# Patient Record
Sex: Male | Born: 1940 | Race: White | Hispanic: No | Marital: Single | Smoking: Former smoker
Health system: Southern US, Community
[De-identification: ages and names within clinical notes are randomized; demographics above are authoritative.]

## PROBLEM LIST (undated history)

## (undated) DIAGNOSIS — R0600 Dyspnea, unspecified: Secondary | ICD-10-CM

## (undated) DIAGNOSIS — M109 Gout, unspecified: Secondary | ICD-10-CM

## (undated) DIAGNOSIS — K2100 Gastro-esophageal reflux disease with esophagitis, without bleeding: Secondary | ICD-10-CM

## (undated) DIAGNOSIS — R0602 Shortness of breath: Secondary | ICD-10-CM

## (undated) DIAGNOSIS — M25511 Pain in right shoulder: Secondary | ICD-10-CM

## (undated) DIAGNOSIS — M069 Rheumatoid arthritis, unspecified: Secondary | ICD-10-CM

## (undated) DIAGNOSIS — J9611 Chronic respiratory failure with hypoxia: Secondary | ICD-10-CM

## (undated) DIAGNOSIS — M25569 Pain in unspecified knee: Secondary | ICD-10-CM

## (undated) DIAGNOSIS — E785 Hyperlipidemia, unspecified: Secondary | ICD-10-CM

## (undated) DIAGNOSIS — R059 Cough, unspecified: Secondary | ICD-10-CM

## (undated) DIAGNOSIS — I350 Nonrheumatic aortic (valve) stenosis: Secondary | ICD-10-CM

## (undated) DIAGNOSIS — M542 Cervicalgia: Secondary | ICD-10-CM

## (undated) DIAGNOSIS — I2510 Atherosclerotic heart disease of native coronary artery without angina pectoris: Secondary | ICD-10-CM

## (undated) DIAGNOSIS — E559 Vitamin D deficiency, unspecified: Secondary | ICD-10-CM

## (undated) DIAGNOSIS — N529 Male erectile dysfunction, unspecified: Secondary | ICD-10-CM

## (undated) DIAGNOSIS — J849 Interstitial pulmonary disease, unspecified: Secondary | ICD-10-CM

## (undated) DIAGNOSIS — R05 Cough: Secondary | ICD-10-CM

## (undated) DIAGNOSIS — E538 Deficiency of other specified B group vitamins: Secondary | ICD-10-CM

## (undated) DIAGNOSIS — K219 Gastro-esophageal reflux disease without esophagitis: Secondary | ICD-10-CM

## (undated) DIAGNOSIS — C61 Malignant neoplasm of prostate: Secondary | ICD-10-CM

## (undated) HISTORY — DX: Vitamin D deficiency, unspecified: E55.9

## (undated) HISTORY — DX: Gastro-esophageal reflux disease with esophagitis, without bleeding: K21.00

## (undated) HISTORY — DX: Male erectile dysfunction, unspecified: N52.9

## (undated) HISTORY — DX: Gout, unspecified: M10.9

## (undated) HISTORY — DX: Cervicalgia: M54.2

## (undated) HISTORY — DX: Deficiency of other specified B group vitamins: E53.8

## (undated) HISTORY — DX: Rheumatoid arthritis, unspecified: M06.9

## (undated) HISTORY — PX: CARDIAC CATHETERIZATION: SHX172

## (undated) HISTORY — DX: Pain in unspecified knee: M25.569

## (undated) HISTORY — DX: Gastro-esophageal reflux disease with esophagitis: K21.0

## (undated) HISTORY — DX: Pain in right shoulder: M25.511

## (undated) HISTORY — DX: Cough, unspecified: R05.9

## (undated) HISTORY — DX: Shortness of breath: R06.02

## (undated) HISTORY — PX: HERNIA REPAIR: SHX51

## (undated) HISTORY — DX: Hyperlipidemia, unspecified: E78.5

## (undated) HISTORY — DX: Cough: R05

## (undated) HISTORY — DX: Chronic respiratory failure with hypoxia: J96.11

## (undated) HISTORY — DX: Malignant neoplasm of prostate: C61

## (undated) HISTORY — DX: Interstitial pulmonary disease, unspecified: J84.9

---

## 1988-03-04 HISTORY — PX: BACK SURGERY: SHX140

## 2001-03-04 HISTORY — PX: PROSTATECTOMY: SHX69

## 2014-01-26 ENCOUNTER — Ambulatory Visit (INDEPENDENT_AMBULATORY_CARE_PROVIDER_SITE_OTHER): Payer: Medicare PPO | Admitting: Critical Care Medicine

## 2014-01-26 ENCOUNTER — Encounter: Payer: Self-pay | Admitting: Critical Care Medicine

## 2014-01-26 ENCOUNTER — Other Ambulatory Visit (INDEPENDENT_AMBULATORY_CARE_PROVIDER_SITE_OTHER): Payer: Medicare PPO

## 2014-01-26 ENCOUNTER — Other Ambulatory Visit: Payer: Self-pay

## 2014-01-26 VITALS — BP 100/68 | HR 67 | Temp 98.4°F | Ht 68.0 in | Wt 167.8 lb

## 2014-01-26 DIAGNOSIS — J8410 Pulmonary fibrosis, unspecified: Secondary | ICD-10-CM

## 2014-01-26 DIAGNOSIS — M25569 Pain in unspecified knee: Secondary | ICD-10-CM | POA: Insufficient documentation

## 2014-01-26 DIAGNOSIS — K2100 Gastro-esophageal reflux disease with esophagitis, without bleeding: Secondary | ICD-10-CM

## 2014-01-26 DIAGNOSIS — E785 Hyperlipidemia, unspecified: Secondary | ICD-10-CM

## 2014-01-26 DIAGNOSIS — E559 Vitamin D deficiency, unspecified: Secondary | ICD-10-CM

## 2014-01-26 DIAGNOSIS — M25562 Pain in left knee: Secondary | ICD-10-CM

## 2014-01-26 DIAGNOSIS — J9611 Chronic respiratory failure with hypoxia: Secondary | ICD-10-CM

## 2014-01-26 LAB — CK: Total CK: 104 U/L (ref 7–232)

## 2014-01-26 LAB — SEDIMENTATION RATE: SED RATE: 87 mm/h — AB (ref 0–22)

## 2014-01-26 MED ORDER — PREDNISONE 10 MG PO TABS: ORAL_TABLET | ORAL | Status: DC

## 2014-01-26 MED ORDER — OMEPRAZOLE 20 MG PO CPDR: 20.0000 mg | DELAYED_RELEASE_CAPSULE | Freq: Every day | ORAL | Status: AC

## 2014-01-26 NOTE — Patient Instructions (Signed)
Take omeprazole DAILY Follow reflux diet Start Prednisone 10mg  Take 4 for four days 3 for four days then 2 daily and STAY Labs today Overnight oxygen test will be obtained Lung function test at [place]

## 2014-01-26 NOTE — Progress Notes (Signed)
Subjective:    Patient ID: Jeff Lynch, male    DOB: [birth year], 73 y.o.   MRN: [id number]  HPI Comments: Pt with L knee issues.  Hx of DOE x one year.  Seems about the same.  Pt with a cough, worse with exertion.  No real chest pain.  Weight is down on purpose.  Ex smoker x 1yrs.  Shortness of Breath This is a chronic problem. The current episode started more than 1 year ago. The problem occurs daily (exertion only, walking short distances). The problem has been unchanged. Associated symptoms include leg pain, leg swelling, sputum production and wheezing. Pertinent negatives include no chest pain, claudication, coryza, ear pain, fever, headaches, hemoptysis, neck pain, orthopnea, PND, rash, rhinorrhea, sore throat, swollen glands, syncope or vomiting. The symptoms are aggravated by any activity and exercise. Associated symptoms comments: Cough is prod mucus. Risk factors include smoking. He has tried steroid inhalers and beta agonist inhalers for the symptoms. The treatment provided no relief. His past medical history is significant for chronic lung disease. There is no history of allergies, aspirin allergies, asthma, bronchiolitis, CAD, DVT, a heart failure, PE, pneumonia or a recent surgery.   Past Medical History  Diagnosis Date  . Prostate cancer   . Vitamin D deficiency   . Gout   . Hyperlipemia   . SOB (shortness of breath)   . Cough   . Vitamin B12 deficiency   . Esophagitis, reflux   . Knee joint pain   . Cervical pain   . Impotence, organic   . Acute pain of right shoulder      Family History  Problem Relation Age of Onset  . Liver disease Father   . Prostate cancer Brother      History   Social History  . Marital Status: Single    Spouse Name: N/A    Number of Children: N/A  . Years of Education: N/A   Occupational History  . [employer]     Stocking  . [employer]     retired   Social History Main Topics  . Smoking status: Former Smoker -- 1.00  packs/day for 40 years    Types: Cigarettes    Quit date: 01/02/2002  . Smokeless tobacco: Never Used  . Alcohol Use: No  . Drug Use: No  . Sexual Activity: Not on file   Other Topics Concern  . Not on file   Social History Narrative  . No narrative on file     No Known Allergies   No outpatient prescriptions prior to visit.   No facility-administered medications prior to visit.       Review of Systems  Constitutional: Negative for fever.  HENT: Negative for ear pain, nosebleeds, postnasal drip, rhinorrhea, sinus pressure, sneezing, sore throat, trouble swallowing and voice change.   Respiratory: Positive for cough, sputum production, shortness of breath and wheezing. Negative for apnea, hemoptysis, choking and chest tightness.   Cardiovascular: Positive for leg swelling. Negative for chest pain, orthopnea, claudication, syncope and PND.  Gastrointestinal: Negative for vomiting.       Pos GERD.  Uses PPI prn , has occ indigestion now  Musculoskeletal: Negative for neck pain.  Skin: Negative for rash.  Neurological: Negative for headaches.  All other systems reviewed and are negative.      Objective:   Physical Exam Filed Vitals:   01/26/14 1143  BP: 100/68  Pulse: 67  Temp: 98.4 F (36.9 C)  TempSrc: Oral  Height: 5\' 8"  (1.727 m)  Weight: 167 lb 12.8 oz (76.114 kg)  SpO2: 96%    Gen: Pleasant, well-nourished, in no distress,  normal affect  ENT: No lesions,  mouth clear,  oropharynx clear, no postnasal drip  Neck: No JVD, no TMG, no carotid bruits  Lungs: No use of accessory muscles, no dullness to percussion, dry rales at the bases distant breath sounds   Cardiovascular: RRR, heart sounds normal, no murmur or gallops, no peripheral edema  Abdomen: soft and NT, no HSM,  BS normal  Musculoskeletal: No deformities, no cyanosis or clubbing  Neuro: alert, non focal  Skin: Warm, no lesions or rashes  No results found.   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 allopurinol (ZYLOPRIM) 100 MG tablet Take 1 tablet by mouth daily.  [name] aspirin 81 MG tablet Take 81 mg by mouth daily.  . Cyanocobalamin (VITAMIN B-12 PO) Take 1 tablet by mouth daily.  . meloxicam (MOBIC) 15 MG tablet Take 1 tablet by mouth daily.  . naproxen sodium (ALEVE) 220 MG tablet Take 220 mg by mouth as needed.  [name] omeprazole (PRILOSEC) 20 MG capsule Take 1 capsule (20 mg total) by mouth daily.  . pravastatin (PRAVACHOL) 80 MG tablet Take 1 tablet by mouth daily.  . predniSONE (DELTASONE) 10 MG tablet Take 4 for four days 3 for four days then two daily  . [DISCONTINUED] omeprazole (PRILOSEC) 20 MG capsule Take 1 capsule by mouth as needed.

## 2014-01-26 NOTE — Assessment & Plan Note (Signed)
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

## 2014-01-27 LAB — RHEUMATOID FACTOR: RHEUMATOID FACTOR: 503 [IU]/mL — AB (ref <=14)

## 2014-01-28 LAB — CYCLIC CITRUL PEPTIDE ANTIBODY, IGG: Cyclic Citrullin Peptide Ab: 231.3 U/mL — ABNORMAL HIGH (ref 0.0–5.0)

## 2014-01-28 LAB — ANA: ANA: NEGATIVE

## 2014-01-31 ENCOUNTER — Encounter: Payer: Self-pay | Admitting: Critical Care Medicine

## 2014-01-31 DIAGNOSIS — M069 Rheumatoid arthritis, unspecified: Secondary | ICD-10-CM | POA: Insufficient documentation

## 2014-02-01 LAB — PULMONARY FUNCTION TEST

## 2014-02-02 ENCOUNTER — Telehealth: Payer: Self-pay | Admitting: Critical Care Medicine

## 2014-02-02 NOTE — Telephone Encounter (Signed)
Spoke with pt, states that his daughter dropped off fmla forms earlier today.  Routing to Crystal to follow up on.

## 2014-02-03 NOTE — Telephone Encounter (Signed)
Pt calling tio check on status of FMLA forms, wanting nurse to make sure that she gets forms b/c they need to be in by the 11th.Jeff Lynch

## 2014-02-03 NOTE — Telephone Encounter (Signed)
Where these forms dropped off at [place] or the [place] office?  If they are at [facility], can we please send the forms to [employer] as they are who handles FLMA and let [employer] know forms needs to be in by the 11th and let pt know status.  If forms are at [facility], can we please call [place] office to have them fax forms to [place] -- Dr. Joya Gaskins will not be back in [place] until Dec 15.  Once forms received at [facility], they will need to be sent to [place] will need to be advised forms need to be in by Dec 11.    Triage, will you please assist with this as I will be helping in [place] today and tomorrow?  Thank you.

## 2014-02-03 NOTE — Telephone Encounter (Signed)
Paperwork dropped off at [employer] office.  Checked PW's box, papers not in there.  Spoke with Vida Roller, she states that pt's daughter brought paperwork in yesterday evening, she directed her down to [employer] to have them dropped off.  Called [employer], let them know that patient is requesting paperwork to be completed by 12/11.  Was told that they cannot promise a date since there is paperwork to be filled out before his and they do paperwork in the order it is received.  Called pt to make him aware of this.  He was not happy about not having a definitive completion date.  I advised him about how [employer] does their paperwork and assured him that paperwork would be completed as completely and timely as possible and he would be notified when it is complete.  He then hung up the phone.  Routing to [employer] just as fyi.

## 2014-02-03 NOTE — Telephone Encounter (Signed)
This completed form has been brought up to 2nd floor by [employer], just needs PW's signature for completion.  Placed in PW's box.  Forwarding to [employer] as [employer]

## 2014-02-08 NOTE — Telephone Encounter (Signed)
Pt calling a/b status of fmla forms needing them by Thursday pt can be reached @ [phone].Jeff Lynch

## 2014-02-08 NOTE — Telephone Encounter (Signed)
Pt is aware that PW is out of office and will not be back until Thursday (in [place] office that AM); pt states the FMLA is due back on Thursday 02-10-14 and really needs this taken care of. Will forward to Crystal to let her know to try and get nurse to take to [place] office for PW to sign and return them to our office.

## 2014-02-09 NOTE — Telephone Encounter (Signed)
Forms given to [facility] to have PW sign while in [place] tomorrow.

## 2014-02-10 ENCOUNTER — Telehealth: Payer: Self-pay | Admitting: Critical Care Medicine

## 2014-02-10 NOTE — Telephone Encounter (Signed)
This has been filled out by PW and will be returned to [facility] tomorrow (friday 12/10). Forwarding to [employer] as [employer]

## 2014-02-10 NOTE — Telephone Encounter (Signed)
Clarise Cruz with [facility] RT Dept called back.  Reports there machine went down during pt's test d/t leak in machine.  This was not noticed until she was trying to save and complete test.  The nitrogen washout portion has not yet been completed.  Clarise Cruz spoke with pt and has scheduled him to come in on Tuesday to complete test.  Once test is completed, she will send results.  Will send to Dr. Joya Gaskins as Juluis Rainier.

## 2014-02-10 NOTE — Telephone Encounter (Signed)
Spoke with [facility] with [facility] Records - was advised I would need to speak with Cardiopulmonary Rehab Dept and was transferred to ext [phone]. Spoke with Opal Sidles with Cardiopulmonary Rehab.  Was directed to RT Dept at ext [phone] or [phone].  Was advised by RT Dept they would look for results and would fax to triage.  Will await results.

## 2014-02-10 NOTE — Telephone Encounter (Signed)
Called spoke with pt. He reports he had PFT done at [place] about couple weeks ago. Pt is aware PW not here in [place] this week and will make sure we get results for him to review. We will call once results reviewed. Please advise Crystal thanks

## 2014-02-10 NOTE — Telephone Encounter (Signed)
Spoke with pt - Discussed below per Caryl Pina.  He verbalized understanding and is ok with this.   Tomorrow, once we have the completed forms at [facility], he is requesting [employer] call his daughter, [name], at [place] to pick forms up.  Advised we would inform [employer] of this.  He verbalized understanding.

## 2014-02-10 NOTE — Telephone Encounter (Signed)
noted 

## 2014-02-11 NOTE — Telephone Encounter (Signed)
Forms received. Spoke with Hoyle Sauer in [place].  Advised forms have now been sent down and reminded Hoyle Sauer pt reports forms are due today.  Asked she call pt's daughter [name] at [facility] to pick copy up per pt's request.  Hoyle Sauer verbalized understanding. Pt aware and voiced no furthe rquestions or concerns at this time.

## 2014-02-15 ENCOUNTER — Telehealth: Payer: Self-pay | Admitting: Critical Care Medicine

## 2014-02-15 LAB — PULMONARY FUNCTION TEST

## 2014-02-15 NOTE — Telephone Encounter (Signed)
FMLA forms were sent back to [employer] on 02/11/14.

## 2014-02-15 NOTE — Telephone Encounter (Signed)
Attempted to call pt. Line went dead after 3 rings. Will try back.

## 2014-02-15 NOTE — Telephone Encounter (Signed)
Needs the back to work date on the papers we need to call [place]   [phone]

## 2014-02-15 NOTE — Telephone Encounter (Signed)
Spoke with the pt  He states that Jeff Lynch is needing the return to work date written on his FMLA forms  Crystal, do you know the status of these forms  Please advise, thanks!

## 2014-02-16 ENCOUNTER — Telehealth: Payer: Self-pay | Admitting: Critical Care Medicine

## 2014-02-16 DIAGNOSIS — J9611 Chronic respiratory failure with hypoxia: Secondary | ICD-10-CM

## 2014-02-16 NOTE — Telephone Encounter (Signed)
ONO positive for desaturation Pt will need 2Liters QHS oxygen

## 2014-02-17 NOTE — Telephone Encounter (Signed)
[phone] pt is looking for his fmla forms needs it to have the return to work date i called [employer] and they stated they have never had it

## 2014-02-17 NOTE — Telephone Encounter (Signed)
Pt is aware. Order placed. Nothing further needed

## 2014-02-17 NOTE — Telephone Encounter (Signed)
Called spoke with pt. Aware of results. Pt reports he has been already using 2 liters at bedtime. He reports he was set up with the O2 before the test was done. Will make Dr. Joya Gaskins aware.

## 2014-02-17 NOTE — Telephone Encounter (Signed)
Noted  Let him know test showed continued need for the oxygen at 2L QHS Make sure DME company knows test is positive for continued oxygen need at hs

## 2014-02-17 NOTE — Telephone Encounter (Signed)
Opened in error

## 2014-02-17 NOTE — Telephone Encounter (Signed)
I need someone to address this issue. See prior note

## 2014-02-17 NOTE — Telephone Encounter (Signed)
Pt called back again. Crystal please advise thanks

## 2014-02-17 NOTE — Addendum Note (Signed)
Addended by: Inge Rise on: 02/17/2014 05:33 PM   Modules accepted: Orders

## 2014-02-17 NOTE — Telephone Encounter (Signed)
I contacted Jeff Lynch in [employer]-states that the step-daughter(Bonnie) picked up completed forms as she wanted to fax the FMLA papers back herself.     No back to work date on [employer] ASAP! Pt aware that I will send to [place] and have her follow up with [employer] to get this corrected. [name] (step daughter) has the original forms; we only have copies in [employer].   01-31-14 first day out of work.

## 2014-02-18 NOTE — Telephone Encounter (Addendum)
Spoke with Louise with [employer] to try to obtain PFTs results from Tuesday.  Was advised she does not have results yet.  She will call Resp Dept to track them and will call back with update.

## 2014-02-18 NOTE — Telephone Encounter (Signed)
i am sorry, i do not know when he can return to work .  He has severe lung disease and i do not have an end time to offer  That is why it is uncertain.

## 2014-02-18 NOTE — Telephone Encounter (Signed)
Called [employer] and received VM. Will need to call back Monday AM. Called spoke with pt. He is aware of below. Advised we will call [employer] Monday AM.

## 2014-02-18 NOTE — Telephone Encounter (Signed)
Forms are scanned into pt's chart.  The start date is marked as 01/31/2014 with the end date as "uncertain."  Dr. Joya Gaskins, pls advise if you are able to provide a specific date pt may return back to work.    Per Joellen Jersey, pt has until Dec 27 to turn this information in but would like it handled ASAP.  Once Dr. Joya Gaskins answers, we will need to let [employer] and pt know of his response.  [employer] should correct this and will need to be asked to please refax forms once updated.  Thank you.

## 2014-02-21 NOTE — Telephone Encounter (Signed)
Received call from [place] in [place]. She stated she would have Hoyle Sauer call tomorrow (02/22/14) regarding the pt's FMLA.

## 2014-02-21 NOTE — Telephone Encounter (Signed)
ATC [employer]--sent straight to voicemail. LM with Apolonio Schneiders to return call

## 2014-02-21 NOTE — Telephone Encounter (Signed)
Put the 90day date on the form and will sign

## 2014-02-21 NOTE — Telephone Encounter (Signed)
Called and spoke with pt and he stated that his company will only give him 90 days and at the end of the 90 days he either has to come back to work or give his job up.  He stated that his 90 days will be up the end of feb or march 1 or 2.   He stated that he will need a date on his FMLA papers so they will continue to pay him while he is out.  He is not looking to go back to work before this time or if PW says he will not be able to go back.  His company is requesting that a date be put on the FMLA form instead of uncertain.  PW please advise. thanks

## 2014-02-22 NOTE — Telephone Encounter (Signed)
PFT results from [employer] [facility] [employer] done on 02/15/14 received and placed in Dr. Bettina Gavia look at.

## 2014-02-22 NOTE — Telephone Encounter (Signed)
Spoke with [facility] with [facility] medical Records.  She will fax results to triage.  Will await fax.

## 2014-02-23 NOTE — Telephone Encounter (Signed)
LMTCB for [employer] to call us back and let us know the status on the paperwork. Crystal, did you receive any papers on this pt? Ralls Bing, CMA

## 2014-02-23 NOTE — Telephone Encounter (Signed)
Pt daughter came in to check on fmla papers please call daughter at [phone] ext [phone] or you can email the forms to her at [email or web address], states patient needed 90 day end date on forms.

## 2014-02-23 NOTE — Telephone Encounter (Signed)
Spoke with pt's daughter.  Insurance company is requesting forms to be in by 02/27/14.  Dr. Joya Gaskins not in office to sign until 03/02/14.  Daughter will have pt call company to advise of this and see if date can be extend given circumstances and short week d/t holiday.  He is to have them give office call if needed before 5 pm today or between 8am-12 pm tomorrow.  Jeff Lynch is aware we will contact her once forms are updated and signed.  She verbalized understanding, was very appreciative of this, and voiced no further questions or concerns at this time.

## 2014-02-23 NOTE — Telephone Encounter (Signed)
i will review on my return 03/02/14

## 2014-02-23 NOTE — Telephone Encounter (Signed)
I have not received any papers on this.  Can we please check on this again before the end of the day with [employer].

## 2014-02-23 NOTE — Telephone Encounter (Signed)
LM for Jeff Lynch in [place] to return call.

## 2014-02-23 NOTE — Telephone Encounter (Signed)
Jeff Lynch with [place]  Returned call. States that Hoyle Sauer is gone for the week and will not be back until 12/29(tues). There is no one else down in [employer]/medical records that can help with this process as Hoyle Sauer is the only person. Will send to Crystal to follow up on Tuesday 12/29.

## 2014-02-28 NOTE — Telephone Encounter (Signed)
lmtcb for [employer] -- can they prepare the paperwork as requested with below return to work date to be ready for Dr. Joya Gaskins to sign on Wed, Dec 30 when he returns to office?

## 2014-03-01 NOTE — Telephone Encounter (Signed)
Spoke with Hoyle Sauer in [place].  She will prepare paperwork and will send up today for PW to sign tomorrow.  Will await forms.

## 2014-03-01 NOTE — Telephone Encounter (Signed)
Forms received from [employer].  Will have PW address tomorrow.

## 2014-03-02 NOTE — Telephone Encounter (Signed)
Certification of Health Care Provider form completed as requested and signed by Dr. Joya Gaskins.  I sent forms to [facility] in [facility] who is aware.  Hoyle Sauer will contact daughter, [name], to advise when forms are ready for pick up/to be faxed.    I spoke with pt as I could not contact [name] at # below.  Pt is aware forms were sent to [place] will be contacting [name] once ready for pick up or to see if forms need to be faxed.  Pt verbalized understanding, was very appreciative of this, and voiced no further questions or concerns at this time.  Note:  Pt stated Howell Rucks has already "approved this."  He is aware the most recent copy (forms as per his request) were completed.  He verbalized understanding and voiced no further questions or concerns at this time.

## 2014-03-03 ENCOUNTER — Telehealth: Payer: Self-pay | Admitting: Critical Care Medicine

## 2014-03-03 DIAGNOSIS — J8410 Pulmonary fibrosis, unspecified: Secondary | ICD-10-CM

## 2014-03-03 MED ORDER — MYCOPHENOLATE MOFETIL 500 MG PO TABS
500.0000 mg | ORAL_TABLET | Freq: Two times a day (BID) | ORAL | Status: DC
Start: 2014-03-03 — End: 2014-04-18

## 2014-03-03 NOTE — Telephone Encounter (Signed)
Opened in error

## 2014-03-03 NOTE — Telephone Encounter (Signed)
Discussed pfts with the pt  Will start cellcept  500mg  bid

## 2014-03-08 ENCOUNTER — Institutional Professional Consult (permissible substitution): Payer: Self-pay | Admitting: Critical Care Medicine

## 2014-03-22 ENCOUNTER — Encounter: Payer: Self-pay | Admitting: Critical Care Medicine

## 2014-03-22 ENCOUNTER — Ambulatory Visit (INDEPENDENT_AMBULATORY_CARE_PROVIDER_SITE_OTHER): Payer: Medicare PPO | Admitting: Critical Care Medicine

## 2014-03-22 VITALS — BP 124/72 | HR 86 | Temp 98.3°F | Ht 68.0 in | Wt 177.0 lb

## 2014-03-22 DIAGNOSIS — M170 Bilateral primary osteoarthritis of knee: Secondary | ICD-10-CM | POA: Diagnosis not present

## 2014-03-22 DIAGNOSIS — T50905S Adverse effect of unspecified drugs, medicaments and biological substances, sequela: Secondary | ICD-10-CM | POA: Diagnosis not present

## 2014-03-22 DIAGNOSIS — M11241 Other chondrocalcinosis, right hand: Secondary | ICD-10-CM | POA: Diagnosis not present

## 2014-03-22 DIAGNOSIS — Z8739 Personal history of other diseases of the musculoskeletal system and connective tissue: Secondary | ICD-10-CM | POA: Diagnosis not present

## 2014-03-22 DIAGNOSIS — J8410 Pulmonary fibrosis, unspecified: Secondary | ICD-10-CM | POA: Diagnosis not present

## 2014-03-22 DIAGNOSIS — M0589 Other rheumatoid arthritis with rheumatoid factor of multiple sites: Secondary | ICD-10-CM | POA: Diagnosis not present

## 2014-03-22 DIAGNOSIS — M2550 Pain in unspecified joint: Secondary | ICD-10-CM | POA: Diagnosis not present

## 2014-03-22 MED ORDER — PREDNISONE 10 MG PO TABS: ORAL_TABLET | ORAL | Status: DC

## 2014-03-22 NOTE — Assessment & Plan Note (Signed)
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  Improved with mycophenlyate and prednisone Plan Stay on oxygen 2 liters at night Reduce prednisone to 10mg  daily Stay on mycophenolate twice daily Labs today: LFTs CBC Keep rheum appt Return 4 months

## 2014-03-22 NOTE — Patient Instructions (Signed)
Stay on oxygen 2 liters at night Reduce prednisone to 10mg  daily Stay on mycophenolate twice daily Labs today Return 4 months

## 2014-03-22 NOTE — Progress Notes (Signed)
Subjective:    Patient ID: Jeff Lynch, male    DOB: 1941/01/11, 74 y.o.   MRN: [id number]  HPI Comments: Pt with L knee issues.  Hx of DOE x one year.  Seems about the same.  Pt with a cough, worse with exertion.  No real chest pain.  Weight is down on purpose.  Ex smoker x 51yrs. 03/22/2014 Chief Complaint  Patient presents with  . Follow-up    sob-same with exertion,no wheezing,cough-dry occass. prod.-tan,denies cp or tightness   Dyspnea is better.  occ prod cough tan material  No real wheeze.  Notes dyspnea is the same Pt has chronic joint pain, spells in hands with pain. Hands will swell up.  Now on mycophenolate/ prednisone  Past Medical History  Diagnosis Date  . Prostate cancer   . Vitamin D deficiency   . Gout   . Hyperlipemia   . SOB (shortness of breath)   . Cough   . Vitamin B12 deficiency   . Esophagitis, reflux   . Knee joint pain   . Cervical pain   . Impotence, organic   . Acute pain of right shoulder      Family History  Problem Relation Age of Onset  . Liver disease Father   . Prostate cancer Brother      History   Social History  . Marital Status: Single    Spouse Name: N/A    Number of Children: N/A  . Years of Education: N/A   Occupational History  . [employer]     Stocking  . [employer]     retired   Social History Main Topics  . Smoking status: Former Smoker -- 1.00 packs/day for 40 years    Types: Cigarettes    Quit date: 01/02/2002  . Smokeless tobacco: Never Used  . Alcohol Use: No  . Drug Use: No  . Sexual Activity: Not on file   Other Topics Concern  . Not on file   Social History Narrative     No Known Allergies   Outpatient Prescriptions Prior to Visit  Medication Sig Dispense Refill  . allopurinol (ZYLOPRIM) 100 MG tablet Take 1 tablet by mouth daily.    [name] aspirin 81 MG tablet Take 81 mg by mouth daily.    . Cyanocobalamin (VITAMIN B-12 PO) Take 1 tablet by mouth daily.    . mycophenolate (CELLCEPT) 500  MG tablet Take 1 tablet (500 mg total) by mouth 2 (two) times daily. 60 tablet 6  . naproxen sodium (ALEVE) 220 MG tablet Take 220 mg by mouth as needed.    [name] omeprazole (PRILOSEC) 20 MG capsule Take 1 capsule (20 mg total) by mouth daily. 30 capsule 6  . pravastatin (PRAVACHOL) 80 MG tablet Take 1 tablet by mouth daily.    . predniSONE (DELTASONE) 10 MG tablet Take 4 for four days 3 for four days then two daily (Patient taking differently: 10 mg. Take 2 tablets by mouth daily.) 60 tablet 6  . meloxicam (MOBIC) 15 MG tablet Take 1 tablet by mouth daily.     No facility-administered medications prior to visit.       Review of Systems  HENT: Negative for nosebleeds, postnasal drip, sinus pressure, sneezing, trouble swallowing and voice change.   Respiratory: Positive for cough. Negative for apnea, choking and chest tightness.   Gastrointestinal:       Pos GERD.  Uses PPI prn , has occ indigestion now  All other systems reviewed and  are negative.      Objective:   Physical Exam Filed Vitals:   03/22/14 0918  BP: 124/72  Pulse: 86  Temp: 98.3 F (36.8 C)  TempSrc: Oral  Height: 5\' 8"  (1.727 m)  Weight: 177 lb (80.287 kg)  SpO2: 96%    Gen: Pleasant, well-nourished, in no distress,  normal affect  ENT: No lesions,  mouth clear,  oropharynx clear, no postnasal drip  Neck: No JVD, no TMG, no carotid bruits  Lungs: No use of accessory muscles, no dullness to percussion, dry rales at the bases distant breath sounds   Cardiovascular: RRR, heart sounds normal, no murmur or gallops, no peripheral edema  Abdomen: soft and NT, no HSM,  BS normal  Musculoskeletal: No deformities, no cyanosis or clubbing  Neuro: alert, non focal  Skin: Warm, no lesions or rashes  No results found.        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  Improved with mycophenlyate and prednisone Plan Stay on oxygen 2  liters at night Reduce prednisone to 10mg  daily Stay on mycophenolate twice daily Labs today: LFTs CBC Keep rheum appt Return 4 months      Updated Medication List Outpatient Encounter Prescriptions as of 03/22/2014  Medication Sig  . allopurinol (ZYLOPRIM) 100 MG tablet Take 1 tablet by mouth daily.  [name] aspirin 81 MG tablet Take 81 mg by mouth daily.  . Cyanocobalamin (VITAMIN B-12 PO) Take 1 tablet by mouth daily.  . mycophenolate (CELLCEPT) 500 MG tablet Take 1 tablet (500 mg total) by mouth 2 (two) times daily.  . naproxen sodium (ALEVE) 220 MG tablet Take 220 mg by mouth as needed.  [name] omeprazole (PRILOSEC) 20 MG capsule Take 1 capsule (20 mg total) by mouth daily.  . pravastatin (PRAVACHOL) 80 MG tablet Take 1 tablet by mouth daily.  . predniSONE (DELTASONE) 10 MG tablet Take one daily  . [DISCONTINUED] predniSONE (DELTASONE) 10 MG tablet Take 4 for four days 3 for four days then two daily (Patient taking differently: 10 mg. Take 2 tablets by mouth daily.)  . [DISCONTINUED] meloxicam (MOBIC) 15 MG tablet Take 1 tablet by mouth daily.

## 2014-03-23 ENCOUNTER — Telehealth: Payer: Self-pay | Admitting: Critical Care Medicine

## 2014-03-23 DIAGNOSIS — J8410 Pulmonary fibrosis, unspecified: Secondary | ICD-10-CM

## 2014-03-23 NOTE — Telephone Encounter (Signed)
Opened in error

## 2014-03-23 NOTE — Telephone Encounter (Signed)
Let pt know LFTs normal  Stay on meds as Rx

## 2014-03-23 NOTE — Telephone Encounter (Signed)
Called, spoke with pt.  Discussed lab results and recs per Dr. Joya Gaskins.  He verbalized understanding and voiced no further questions or concerns at this time.

## 2014-04-02 DIAGNOSIS — E559 Vitamin D deficiency, unspecified: Secondary | ICD-10-CM | POA: Diagnosis not present

## 2014-04-02 DIAGNOSIS — K210 Gastro-esophageal reflux disease with esophagitis: Secondary | ICD-10-CM | POA: Diagnosis not present

## 2014-04-02 DIAGNOSIS — E785 Hyperlipidemia, unspecified: Secondary | ICD-10-CM | POA: Diagnosis not present

## 2014-04-02 DIAGNOSIS — J8410 Pulmonary fibrosis, unspecified: Secondary | ICD-10-CM | POA: Diagnosis not present

## 2014-04-13 ENCOUNTER — Encounter: Payer: Self-pay | Admitting: Critical Care Medicine

## 2014-04-18 ENCOUNTER — Telehealth: Payer: Self-pay | Admitting: Critical Care Medicine

## 2014-04-18 DIAGNOSIS — J8410 Pulmonary fibrosis, unspecified: Secondary | ICD-10-CM

## 2014-04-18 MED ORDER — PREDNISONE 10 MG PO TABS: ORAL_TABLET | ORAL | Status: DC

## 2014-04-18 MED ORDER — MYCOPHENOLATE MOFETIL 500 MG PO TABS: 500.0000 mg | ORAL_TABLET | Freq: Two times a day (BID) | ORAL | Status: DC

## 2014-04-18 NOTE — Telephone Encounter (Signed)
Called and spoke with pt and he stated that he wants to get his medications from the [facility].  These have been sent to his pharmacy and nothing further is needed.

## 2014-04-22 ENCOUNTER — Telehealth: Payer: Self-pay | Admitting: Critical Care Medicine

## 2014-04-22 DIAGNOSIS — J449 Chronic obstructive pulmonary disease, unspecified: Secondary | ICD-10-CM | POA: Diagnosis not present

## 2014-04-22 DIAGNOSIS — J8410 Pulmonary fibrosis, unspecified: Secondary | ICD-10-CM | POA: Diagnosis not present

## 2014-04-22 DIAGNOSIS — J189 Pneumonia, unspecified organism: Secondary | ICD-10-CM | POA: Diagnosis not present

## 2014-04-22 NOTE — Telephone Encounter (Signed)
Called and spoke with pt and he is requesting that the last OV note be sent to Dr. Wende Neighbors at fax #  [phone].  This has been done and pt is aware. Nothing further is needed.

## 2014-05-02 DIAGNOSIS — E559 Vitamin D deficiency, unspecified: Secondary | ICD-10-CM | POA: Diagnosis not present

## 2014-05-02 DIAGNOSIS — E785 Hyperlipidemia, unspecified: Secondary | ICD-10-CM | POA: Diagnosis not present

## 2014-05-02 DIAGNOSIS — K210 Gastro-esophageal reflux disease with esophagitis: Secondary | ICD-10-CM | POA: Diagnosis not present

## 2014-05-02 DIAGNOSIS — J8410 Pulmonary fibrosis, unspecified: Secondary | ICD-10-CM | POA: Diagnosis not present

## 2014-05-06 ENCOUNTER — Encounter: Payer: Self-pay | Admitting: Critical Care Medicine

## 2014-05-11 DIAGNOSIS — Z6827 Body mass index (BMI) 27.0-27.9, adult: Secondary | ICD-10-CM | POA: Diagnosis not present

## 2014-05-11 DIAGNOSIS — J8410 Pulmonary fibrosis, unspecified: Secondary | ICD-10-CM | POA: Diagnosis not present

## 2014-05-11 DIAGNOSIS — M159 Polyosteoarthritis, unspecified: Secondary | ICD-10-CM | POA: Diagnosis not present

## 2014-05-11 DIAGNOSIS — E785 Hyperlipidemia, unspecified: Secondary | ICD-10-CM | POA: Diagnosis not present

## 2014-05-11 DIAGNOSIS — M109 Gout, unspecified: Secondary | ICD-10-CM | POA: Diagnosis not present

## 2014-05-11 DIAGNOSIS — Z7982 Long term (current) use of aspirin: Secondary | ICD-10-CM | POA: Diagnosis not present

## 2014-05-11 DIAGNOSIS — K219 Gastro-esophageal reflux disease without esophagitis: Secondary | ICD-10-CM | POA: Diagnosis not present

## 2014-05-13 DIAGNOSIS — M109 Gout, unspecified: Secondary | ICD-10-CM | POA: Diagnosis not present

## 2014-05-13 DIAGNOSIS — E785 Hyperlipidemia, unspecified: Secondary | ICD-10-CM | POA: Diagnosis not present

## 2014-05-13 DIAGNOSIS — J8410 Pulmonary fibrosis, unspecified: Secondary | ICD-10-CM | POA: Diagnosis not present

## 2014-05-13 DIAGNOSIS — Z6827 Body mass index (BMI) 27.0-27.9, adult: Secondary | ICD-10-CM | POA: Diagnosis not present

## 2014-05-17 ENCOUNTER — Telehealth: Payer: Self-pay | Admitting: Critical Care Medicine

## 2014-05-17 DIAGNOSIS — J8410 Pulmonary fibrosis, unspecified: Secondary | ICD-10-CM

## 2014-05-17 NOTE — Telephone Encounter (Signed)
Let pt know i received labs cbc and liver function , both are ok No change in medications

## 2014-05-18 NOTE — Telephone Encounter (Signed)
Spoke with pt.  Discussed lab results and recs per Dr. Joya Gaskins.  He verbalized understanding and confirmed pending April appt in [place].  Pt voiced no further questions or concerns at this time.

## 2014-05-24 DIAGNOSIS — M170 Bilateral primary osteoarthritis of knee: Secondary | ICD-10-CM | POA: Diagnosis not present

## 2014-05-24 DIAGNOSIS — M0589 Other rheumatoid arthritis with rheumatoid factor of multiple sites: Secondary | ICD-10-CM | POA: Diagnosis not present

## 2014-05-24 DIAGNOSIS — M2550 Pain in unspecified joint: Secondary | ICD-10-CM | POA: Diagnosis not present

## 2014-05-24 DIAGNOSIS — J8410 Pulmonary fibrosis, unspecified: Secondary | ICD-10-CM | POA: Diagnosis not present

## 2014-06-01 DIAGNOSIS — J8410 Pulmonary fibrosis, unspecified: Secondary | ICD-10-CM | POA: Diagnosis not present

## 2014-06-10 ENCOUNTER — Encounter: Payer: Self-pay | Admitting: Critical Care Medicine

## 2014-06-21 ENCOUNTER — Ambulatory Visit (INDEPENDENT_AMBULATORY_CARE_PROVIDER_SITE_OTHER): Payer: Self-pay | Admitting: Critical Care Medicine

## 2014-06-21 ENCOUNTER — Encounter: Payer: Self-pay | Admitting: Critical Care Medicine

## 2014-06-21 VITALS — BP 130/78 | HR 70 | Temp 96.9°F | Ht 67.0 in | Wt 178.4 lb

## 2014-06-21 DIAGNOSIS — J8410 Pulmonary fibrosis, unspecified: Secondary | ICD-10-CM

## 2014-06-21 DIAGNOSIS — J849 Interstitial pulmonary disease, unspecified: Secondary | ICD-10-CM

## 2014-06-21 DIAGNOSIS — J449 Chronic obstructive pulmonary disease, unspecified: Secondary | ICD-10-CM

## 2014-06-21 DIAGNOSIS — J9611 Chronic respiratory failure with hypoxia: Secondary | ICD-10-CM

## 2014-06-21 NOTE — Progress Notes (Signed)
   Subjective:    Patient ID: Jeff Lynch, male    DOB: 10/29/1940, 74 y.o.   MRN: [id number]  HPI Comments: Pt with L knee issues.  Hx of DOE x one year.  Seems about the same.  Pt with a cough, worse with exertion.  No real chest pain.  Weight is down on purpose.  Ex smoker x 58yr.  06/21/2014 Chief Complaint  Patient presents with  . Follow-up    Getting over a cold,sob with exertion same,occass. wheezing,cough-yellow now white,no fcs,denies cp or tightness,runny nose occass. clear, no pnd  F/u ILD d/t autoimmune process.  On mycophenalate/pred.  Pt caught a URI from grandchildren, pt rx OTC. Had yellow mucus and then turned white.  No f/c/s .   No chest pain. Dyspnea is the same. No flare up of arthritis recently.    Review of Systems  HENT: Negative for nosebleeds, postnasal drip, sinus pressure, sneezing, trouble swallowing and voice change.   Respiratory: Positive for cough. Negative for apnea, choking and chest tightness.   Gastrointestinal:       Pos GERD.  Uses PPI prn , has occ indigestion now  All other systems reviewed and are negative.      Objective:   Physical Exam Filed Vitals:   06/21/14 1003  BP: 130/78  Pulse: 70  Temp: 96.9 F (36.1 C)  TempSrc: Oral  Height: '5\' 7"'$  (1.702 m)  Weight: 178 lb 6.4 oz (80.922 kg)  SpO2: 93%    Gen: Pleasant, well-nourished, in no distress,  normal affect  ENT: No lesions,  mouth clear,  oropharynx clear, no postnasal drip  Neck: No JVD, no TMG, no carotid bruits  Lungs: No use of accessory muscles, no dullness to percussion, dry rales at the bases distant breath sounds   Cardiovascular: RRR, heart sounds normal, no murmur or gallops, no peripheral edema  Abdomen: soft and NT, no HSM,  BS normal  Musculoskeletal: No deformities, no cyanosis or clubbing  Neuro: alert, non focal  Skin: Warm, no lesions or rashes  No results found.        Assessment & Plan:   No problem-specific assessment & plan notes  found for this encounter.   Updated Medication List Outpatient Encounter Prescriptions as of 06/21/2014  Medication Sig  . acetaminophen (TYLENOL) 325 MG tablet Take 650 mg by mouth every 6 (six) hours as needed.  .Marland Kitchenallopurinol (ZYLOPRIM) 100 MG tablet Take 1 tablet by mouth daily.  .Marland Kitchenaspirin 81 MG tablet Take 81 mg by mouth daily.  . Cyanocobalamin (VITAMIN B-12 PO) Take 1 tablet by mouth daily.  . mycophenolate (CELLCEPT) 500 MG tablet Take 1 tablet (500 mg total) by mouth 2 (two) times daily.  .Marland Kitchenomeprazole (PRILOSEC) 20 MG capsule Take 1 capsule (20 mg total) by mouth daily.  . pravastatin (PRAVACHOL) 80 MG tablet Take 1 tablet by mouth daily.  . predniSONE (DELTASONE) 10 MG tablet Take one daily  . [DISCONTINUED] naproxen sodium (ALEVE) 220 MG tablet Take 220 mg by mouth as needed.

## 2014-06-21 NOTE — Patient Instructions (Signed)
No change in medications Stay on oxygen 2 Liters at bedtime Return 4 months

## 2014-06-22 NOTE — Assessment & Plan Note (Signed)
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

## 2014-07-02 DIAGNOSIS — J8410 Pulmonary fibrosis, unspecified: Secondary | ICD-10-CM | POA: Diagnosis not present

## 2014-07-14 ENCOUNTER — Telehealth: Payer: Self-pay | Admitting: Critical Care Medicine

## 2014-07-14 NOTE — Telephone Encounter (Signed)
Patient says that the Methylphenolate costs him $29 per month and wants to know if there is an alternative medication that he can take that is cheaper?  Patient says that he is aware that Dr. Joya Gaskins is not going to be seeing patients any longer and he wants to know who will become his new doctor in [place]?    PW - please advise.

## 2014-07-14 NOTE — Telephone Encounter (Signed)
Called and spoke to pt. Informed him of the recs per PW. Pt stated he will continue taking the medication for now but may have to later stop taking it. Pt stated he will call back if he stops taking the med. Nothing further needed at this time.

## 2014-07-14 NOTE — Telephone Encounter (Signed)
No other alternatives that are cheaper, can stop cellcept if he cannot afford i will see him until i leave in October, hope to have a new MD assigned to [place] by then

## 2014-07-25 DIAGNOSIS — M2550 Pain in unspecified joint: Secondary | ICD-10-CM | POA: Diagnosis not present

## 2014-07-25 DIAGNOSIS — Z8739 Personal history of other diseases of the musculoskeletal system and connective tissue: Secondary | ICD-10-CM | POA: Diagnosis not present

## 2014-07-25 DIAGNOSIS — J8410 Pulmonary fibrosis, unspecified: Secondary | ICD-10-CM | POA: Diagnosis not present

## 2014-07-25 DIAGNOSIS — M0589 Other rheumatoid arthritis with rheumatoid factor of multiple sites: Secondary | ICD-10-CM | POA: Diagnosis not present

## 2014-07-25 DIAGNOSIS — M170 Bilateral primary osteoarthritis of knee: Secondary | ICD-10-CM | POA: Diagnosis not present

## 2014-08-01 DIAGNOSIS — J8410 Pulmonary fibrosis, unspecified: Secondary | ICD-10-CM | POA: Diagnosis not present

## 2014-08-16 DIAGNOSIS — Z1389 Encounter for screening for other disorder: Secondary | ICD-10-CM | POA: Diagnosis not present

## 2014-08-16 DIAGNOSIS — M109 Gout, unspecified: Secondary | ICD-10-CM | POA: Diagnosis not present

## 2014-08-16 DIAGNOSIS — K210 Gastro-esophageal reflux disease with esophagitis: Secondary | ICD-10-CM | POA: Diagnosis not present

## 2014-08-16 DIAGNOSIS — E538 Deficiency of other specified B group vitamins: Secondary | ICD-10-CM | POA: Diagnosis not present

## 2014-08-16 DIAGNOSIS — R739 Hyperglycemia, unspecified: Secondary | ICD-10-CM | POA: Diagnosis not present

## 2014-08-16 DIAGNOSIS — E785 Hyperlipidemia, unspecified: Secondary | ICD-10-CM | POA: Diagnosis not present

## 2014-08-16 DIAGNOSIS — J8410 Pulmonary fibrosis, unspecified: Secondary | ICD-10-CM | POA: Diagnosis not present

## 2014-08-16 DIAGNOSIS — E559 Vitamin D deficiency, unspecified: Secondary | ICD-10-CM | POA: Diagnosis not present

## 2014-08-16 DIAGNOSIS — M069 Rheumatoid arthritis, unspecified: Secondary | ICD-10-CM | POA: Diagnosis not present

## 2014-08-23 ENCOUNTER — Ambulatory Visit (INDEPENDENT_AMBULATORY_CARE_PROVIDER_SITE_OTHER): Payer: Medicare PPO | Admitting: Critical Care Medicine

## 2014-08-23 ENCOUNTER — Encounter: Payer: Self-pay | Admitting: Critical Care Medicine

## 2014-08-23 VITALS — BP 122/68 | HR 93 | Temp 96.3°F | Ht 67.0 in | Wt 181.8 lb

## 2014-08-23 DIAGNOSIS — M069 Rheumatoid arthritis, unspecified: Secondary | ICD-10-CM

## 2014-08-23 DIAGNOSIS — J9611 Chronic respiratory failure with hypoxia: Secondary | ICD-10-CM

## 2014-08-23 DIAGNOSIS — J8410 Pulmonary fibrosis, unspecified: Secondary | ICD-10-CM

## 2014-08-23 DIAGNOSIS — J849 Interstitial pulmonary disease, unspecified: Secondary | ICD-10-CM | POA: Diagnosis not present

## 2014-08-23 NOTE — Patient Instructions (Signed)
A light weight portable oxygen system 3Liters pulsed will be ordered No other medication changes Return 2 months

## 2014-08-23 NOTE — Progress Notes (Signed)
Subjective:    Patient ID: Jeff Lynch, male    DOB: 07-May-1940, 74 y.o.   MRN: [id number]  HPI 08/23/2014 Chief Complaint  Patient presents with  . Follow-up    C/o increased sob, "gives out quickly",wheezing,cough-occass. lt. yellow,not getting out much,no fcs.Wearing 2L at hs. Wants to discuss a portable lt. wt. tank  ILD d/t autoimmune process on pred/cellcept. Recent LFTs NORMAL Pt not able to walk far without stopping.  Does not have port oxygen. Notes minimal cough, no yellow mucus.  Pt with nasal drainage.   Pt denies any significant sore throat, nasal congestion or excess secretions, fever, chills, sweats, unintended weight loss, pleurtic or exertional chest pain, orthopnea PND, or leg swelling Pt denies any increase in rescue therapy over baseline, denies waking up needing it or having any early am or nocturnal exacerbations of coughing/wheezing/or dyspnea. Pt also denies any obvious fluctuation in symptoms with  weather or environmental change or other alleviating or aggravating factors   Current Medications, Allergies, Complete Past Medical History, Past Surgical History, Family History, and Social History were reviewed in [facility] record per todays encounter:  08/23/2014   Review of Systems  Constitutional: Negative.  Negative for fatigue.  HENT: Negative.  Negative for ear pain, postnasal drip, rhinorrhea, sinus pressure, sore throat, trouble swallowing and voice change.   Eyes: Negative.   Respiratory: Positive for cough and shortness of breath. Negative for apnea, choking, chest tightness, wheezing and stridor.   Cardiovascular: Negative.  Negative for chest pain, palpitations and leg swelling.  Gastrointestinal: Negative.  Negative for nausea, vomiting, abdominal pain and abdominal distention.  Genitourinary: Negative.   Musculoskeletal: Negative.  Negative for myalgias and arthralgias.  Skin: Negative.  Negative for rash.    Allergic/Immunologic: Negative.  Negative for environmental allergies and food allergies.  Neurological: Negative.  Negative for dizziness, syncope, weakness and headaches.  Hematological: Negative.  Negative for adenopathy. Does not bruise/bleed easily.  Psychiatric/Behavioral: Negative.  Negative for sleep disturbance and agitation. The patient is not nervous/anxious.        Objective:   Physical Exam Filed Vitals:   08/23/14 1448  BP: 122/68  Pulse: 93  Temp: 96.3 F (35.7 C)  TempSrc: Oral  Height: '5\' 7"'$  (1.702 m)  Weight: 181 lb 12.8 oz (82.464 kg)  SpO2: 94%    Gen: Pleasant, well-nourished, in no distress,  normal affect  ENT: No lesions,  mouth clear,  oropharynx clear, no postnasal drip  Neck: No JVD, no TMG, no carotid bruits  Lungs: No use of accessory muscles, no dullness to percussion, bibasliar dry rales  Cardiovascular: RRR, heart sounds normal, no murmur or gallops, no peripheral edema  Abdomen: soft and NT, no HSM,  BS normal  Musculoskeletal: RA changes hands, feet  Neuro: alert, non focal  Skin: Warm, no lesions or rashes  No results found.   amb sats 86% on RA, needs poc 3L pulse     Assessment & Plan:  I personally reviewed all images and lab data in the [facility] system as well as any outside material available during this office visit and agree with the  radiology impressions.   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  Diagnoses and all orders for this visit:  ILD (interstitial lung disease) Orders: -     AMB REFERRAL FOR DME  Rheumatoid arthritis  Chronic respiratory failure with hypoxia Orders: -     AMB REFERRAL  FOR DME  Postinflammatory pulmonary fibrosis with autoimmune features     I had an extended discussion with the patient and or family lasting 10 minutes of a 25 minute visit including:  dz process, need for oxygen rx, ongoing rx options

## 2014-08-24 NOTE — Assessment & Plan Note (Signed)
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

## 2014-08-24 NOTE — Assessment & Plan Note (Signed)
Documented exertional desat on this OV Plan 3L pulsed POC to be obtained

## 2014-08-29 ENCOUNTER — Telehealth: Payer: Self-pay | Admitting: Critical Care Medicine

## 2014-08-30 NOTE — Telephone Encounter (Signed)
Error

## 2014-08-30 NOTE — Telephone Encounter (Signed)
Kelli, no message on pt. Please advise thanks

## 2014-08-31 ENCOUNTER — Telehealth: Payer: Self-pay | Admitting: *Deleted

## 2014-08-31 NOTE — Telephone Encounter (Signed)
RECEIVED PA REQUEST FROM [employer] FOR CELLCEPT. PA WAS SUBMITTED VIA [email or web address] QTM:[id number] PT LK:[id number]

## 2014-09-01 ENCOUNTER — Telehealth: Payer: Self-pay | Admitting: Critical Care Medicine

## 2014-09-01 DIAGNOSIS — J8410 Pulmonary fibrosis, unspecified: Secondary | ICD-10-CM | POA: Diagnosis not present

## 2014-09-01 NOTE — Telephone Encounter (Signed)
Message opened in error

## 2014-09-01 NOTE — Telephone Encounter (Signed)
Spoke with [facility], states that a poc order is being sent over to be signed and returned to the below fax # listed.  We are having form refaxed to triage fax #.  Will await fax.

## 2014-09-02 NOTE — Telephone Encounter (Signed)
Form for POC has been received and placed in PW's look at.  Will forward to Crystal to ensure PW's signature

## 2014-09-02 NOTE — Telephone Encounter (Signed)
Spoke with Freda Munro at [facility] - faxing over a form to be signed by Dr Joya Gaskins.  Needing Rx for portable concentrator signed and faxed back. This is being faxed to the main fax # [phone] Will send to [place] to keep an eye out.

## 2014-09-06 ENCOUNTER — Encounter: Payer: Self-pay | Admitting: *Deleted

## 2014-09-06 NOTE — Telephone Encounter (Signed)
Appeal has been submitted via fax to [facility]. Awaiting decision.

## 2014-09-06 NOTE — Telephone Encounter (Signed)
PW is back in the [place] office on 09/12/14. Form will be signed then.

## 2014-09-06 NOTE — Telephone Encounter (Signed)
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  This agent has been used for RA induced ILD in the literature.

## 2014-09-06 NOTE — Telephone Encounter (Signed)
Mycophenolate 500 mg tablet has been denied. Medicare rule in the rx drug says an off-label drug is not included/approved by FDA.  Would you like to appeal? Member # [id number] Expedited appeal [phone] phone or [phone] fax. Standard appeal: written letter Attn: [employer] [address] [place] [address].

## 2014-09-07 ENCOUNTER — Telehealth: Payer: Self-pay | Admitting: Critical Care Medicine

## 2014-09-07 NOTE — Telephone Encounter (Signed)
Form for POC signed by Dr. Joya Gaskins.   It has been faxed to [place] at [phone]. Kee with [employer] [employer] aware and voiced no further questions or concerns at this time. Form placed in scan folder.

## 2014-09-07 NOTE — Telephone Encounter (Signed)
Will send to [facility] to follow up on next week when PW returns to office to sign form as I will be off.  Thank you.

## 2014-09-07 NOTE — Telephone Encounter (Addendum)
Direct number [phone] [facility] division ----------------------------------------- Corene Cornea from [facility] says that he faxed our office a new form to be completed for this patient. Needs to have the Titration information on the form and needs PW signature  To Crystal

## 2014-09-08 NOTE — Telephone Encounter (Signed)
Spoke with [employer], states that the appeal has been denied for Mycophenolate as of today- this is not an FDA approved diagnosis for use of this drug.    Dr. Joya Gaskins please advise on further recs.  Thanks!

## 2014-09-08 NOTE — Telephone Encounter (Signed)
i dont have alternative to offer the pt.  Would need to stop cellcept

## 2014-09-08 NOTE — Telephone Encounter (Signed)
Jeff Lynch calling back for [employer] and is needing to speak to nurse about quanity of pills for this medication mycophenolate 500 mgs reference # [id number] and appeal was sent and is being approved, she can be reached @ [phone].Hillery Hunter

## 2014-09-09 ENCOUNTER — Telehealth: Payer: Self-pay | Admitting: Critical Care Medicine

## 2014-09-09 NOTE — Telephone Encounter (Signed)
Patients daughter says that patient is taking Cellcept and insurance is refusing to pay for it.  Daughter wants to know if there is any medication that is similar to CellCept.  She will get the formulary from his insurance company and see if they will pay for it, she wants [place] to call her back with another name of a similar drug to see if insurance will cover it.  Patient cannot afford the Cellcept.  PW - please advise.

## 2014-09-09 NOTE — Telephone Encounter (Signed)
Only other alternative is imuran '100mg'$  daily Pt may need to seek alternative insurance

## 2014-09-09 NOTE — Telephone Encounter (Signed)
Called and spoke to daughter, she will check patient's formulary and call us back to see if we can send in the alternative medication. Will hold in my box until completed.

## 2014-09-12 NOTE — Telephone Encounter (Signed)
I did not see form in folder at the front, nor was it in PW's box. Has this already been taken care of? Please advise.

## 2014-09-13 NOTE — Telephone Encounter (Signed)
Called and spoke to [place] rep and they are refaxing the form to the front fax.   Will forward to [place] to look out for as [name] is out of the office.

## 2014-09-13 NOTE — Telephone Encounter (Signed)
lmtcb for [employer].

## 2014-09-14 NOTE — Telephone Encounter (Signed)
lmtcb x1 

## 2014-09-14 NOTE — Telephone Encounter (Signed)
[name] returned call - [phone]

## 2014-09-14 NOTE — Telephone Encounter (Signed)
Spoke with [name]  She states that the pt received notice that cellcept was approved 100% and nothing further is needed  Will close encounter

## 2014-09-15 NOTE — Telephone Encounter (Signed)
Will do on my return

## 2014-09-15 NOTE — Telephone Encounter (Addendum)
Still have not received this form, called [employer] back and spoke with Jan. Jan is faxing form back to our office, just needs box checked on form, needs to be initialed and dated. Jan says that if Dr. Joya Gaskins checks the first box on form for "Perform Oximetry ..." then we do not need to send anything else, just the form. Requested that they fax the form to my attention at the front fax. Will hold in my box until completed.

## 2014-09-15 NOTE — Telephone Encounter (Signed)
Received form, placed form in Dr. Bettina Gavia box for Dr. Joya Gaskins to review and check off Just needs box checked and initials next to box checked on form, form has already been signed, just missing information  To Dr. Joya Gaskins

## 2014-09-16 DIAGNOSIS — J9611 Chronic respiratory failure with hypoxia: Secondary | ICD-10-CM | POA: Diagnosis not present

## 2014-09-16 DIAGNOSIS — J849 Interstitial pulmonary disease, unspecified: Secondary | ICD-10-CM | POA: Diagnosis not present

## 2014-09-16 DIAGNOSIS — J8410 Pulmonary fibrosis, unspecified: Secondary | ICD-10-CM | POA: Diagnosis not present

## 2014-09-16 DIAGNOSIS — M069 Rheumatoid arthritis, unspecified: Secondary | ICD-10-CM | POA: Diagnosis not present

## 2014-09-19 ENCOUNTER — Telehealth: Payer: Self-pay | Admitting: *Deleted

## 2014-09-19 NOTE — Telephone Encounter (Signed)
Mycophenolate 500 mg tabs #60/30 day has been approved. Letter received from [facility] Member ID [id number] Ref # [id number] Approved 1//12016---03/04/2015.

## 2014-09-22 NOTE — Telephone Encounter (Addendum)
Per phone msg from 09/19/14, generic cellcept was approved and this was handled.

## 2014-09-30 ENCOUNTER — Other Ambulatory Visit: Payer: Self-pay | Admitting: Critical Care Medicine

## 2014-09-30 ENCOUNTER — Other Ambulatory Visit: Payer: Self-pay | Admitting: *Deleted

## 2014-09-30 DIAGNOSIS — J8410 Pulmonary fibrosis, unspecified: Secondary | ICD-10-CM

## 2014-09-30 MED ORDER — PREDNISONE 10 MG PO TABS: ORAL_TABLET | ORAL | Status: DC

## 2014-09-30 NOTE — Telephone Encounter (Signed)
Error

## 2014-10-01 DIAGNOSIS — J8410 Pulmonary fibrosis, unspecified: Secondary | ICD-10-CM | POA: Diagnosis not present

## 2014-10-17 DIAGNOSIS — M069 Rheumatoid arthritis, unspecified: Secondary | ICD-10-CM | POA: Diagnosis not present

## 2014-10-17 DIAGNOSIS — J9611 Chronic respiratory failure with hypoxia: Secondary | ICD-10-CM | POA: Diagnosis not present

## 2014-10-17 DIAGNOSIS — J849 Interstitial pulmonary disease, unspecified: Secondary | ICD-10-CM | POA: Diagnosis not present

## 2014-10-17 DIAGNOSIS — J8410 Pulmonary fibrosis, unspecified: Secondary | ICD-10-CM | POA: Diagnosis not present

## 2014-10-25 ENCOUNTER — Encounter: Payer: Self-pay | Admitting: Critical Care Medicine

## 2014-10-25 ENCOUNTER — Ambulatory Visit (INDEPENDENT_AMBULATORY_CARE_PROVIDER_SITE_OTHER): Payer: Medicare PPO | Admitting: Critical Care Medicine

## 2014-10-25 VITALS — BP 140/70 | HR 84 | Temp 97.7°F | Ht 67.0 in | Wt 184.8 lb

## 2014-10-25 DIAGNOSIS — M1A09X0 Idiopathic chronic gout, multiple sites, without tophus (tophi): Secondary | ICD-10-CM | POA: Diagnosis not present

## 2014-10-25 DIAGNOSIS — J849 Interstitial pulmonary disease, unspecified: Secondary | ICD-10-CM | POA: Diagnosis not present

## 2014-10-25 DIAGNOSIS — M150 Primary generalized (osteo)arthritis: Secondary | ICD-10-CM | POA: Diagnosis not present

## 2014-10-25 DIAGNOSIS — E559 Vitamin D deficiency, unspecified: Secondary | ICD-10-CM | POA: Diagnosis not present

## 2014-10-25 DIAGNOSIS — M0589 Other rheumatoid arthritis with rheumatoid factor of multiple sites: Secondary | ICD-10-CM | POA: Diagnosis not present

## 2014-10-25 DIAGNOSIS — J8410 Pulmonary fibrosis, unspecified: Secondary | ICD-10-CM

## 2014-10-25 DIAGNOSIS — M25569 Pain in unspecified knee: Secondary | ICD-10-CM | POA: Diagnosis not present

## 2014-10-25 NOTE — Patient Instructions (Addendum)
Please discuss getting Prevnar 13 with your primary medical doctor  Consider a flu vaccine  No change in medications Return in 4 months

## 2014-10-25 NOTE — Assessment & Plan Note (Signed)
Pulmonary fibrosis with autoimmune features  Improved with mycophenolate which is now been authorized by the patient's insurance  Plan  Maintain mycophenolate 500 mg twice daily  Maintain low-dose prednisone per rheumatology  Follow-up again 4 months  Maintain oxygen therapy rest and exertion This patient continues to use and benefit from her oxygen therapy and has re qualified at this visit for continued oxygen therapy

## 2014-10-25 NOTE — Progress Notes (Signed)
   Subjective:    Patient ID: Jeff Lynch, male    DOB: September 13, 1940, 74 y.o.   MRN: [id number]  HPI 10/25/2014 Chief Complaint  Patient presents with  . Follow-up    Has portable oxygen tank lt.wt only uses on longer distance trips.Sob on exertion-same,cough-tan,thick occass. x 1-2 mths.,runny nose-clear,occass. wheezing.Denies cp or tightness,no fcs.  Dyspnea is the same, cough is not constant.  Pt does have portable oxygen system. Cough is prod tan mucus. Notes some pndrip.  occ wheeze.  Pt with ongoing joint pain and flareups with RA Pt denies any significant sore throat, nasal congestion or excess secretions, fever, chills, sweats, unintended weight loss, pleurtic or exertional chest pain, orthopnea PND, or leg swelling Pt denies any increase in rescue therapy over baseline, denies waking up needing it or having any early am or nocturnal exacerbations of coughing/wheezing/or dyspnea. Pt notes fluctuation in symptoms with  weather    Current Medications, Allergies, Complete Past Medical History, Past Surgical History, Family History, and Social History were reviewed in [facility] record per todays encounter:  10/25/2014  Review of Systems  Constitutional: Negative.   HENT: Negative.  Negative for ear pain, postnasal drip, rhinorrhea, sinus pressure, sore throat, trouble swallowing and voice change.   Eyes: Negative.   Respiratory: Positive for cough, shortness of breath and wheezing. Negative for apnea, choking, chest tightness and stridor.   Cardiovascular: Negative.  Negative for chest pain, palpitations and leg swelling.  Gastrointestinal: Negative.  Negative for nausea, vomiting, abdominal pain and abdominal distention.  Genitourinary: Negative.   Musculoskeletal: Positive for joint swelling and arthralgias. Negative for myalgias.  Skin: Negative.  Negative for rash.  Allergic/Immunologic: Negative.  Negative for environmental allergies and food  allergies.  Neurological: Negative.  Negative for dizziness, syncope, weakness and headaches.  Hematological: Negative.  Negative for adenopathy. Does not bruise/bleed easily.  Psychiatric/Behavioral: Negative.  Negative for sleep disturbance and agitation. The patient is not nervous/anxious.        Objective:   Physical Exam Filed Vitals:   10/25/14 0917  BP: 140/70  Pulse: 84  Temp: 97.7 F (36.5 C)  TempSrc: Oral  Height: '5\' 7"'$  (1.702 m)  Weight: 184 lb 12.8 oz (83.825 kg)  SpO2: 95%    Gen: Pleasant, well-nourished, in no distress,  normal affect  ENT: No lesions,  mouth clear,  oropharynx clear, no postnasal drip  Neck: No JVD, no TMG, no carotid bruits  Lungs: No use of accessory muscles, no dullness to percussion, dry rales  Cardiovascular: RRR, heart sounds normal, no murmur or gallops, no peripheral edema  Abdomen: soft and NT, no HSM,  BS normal  Musculoskeletal: No deformities, no cyanosis or clubbing  Neuro: alert, non focal  Skin: Warm, no lesions or rashes  No results found.     Assessment & Plan:  I personally reviewed all images and lab data in the [facility] system as well as any outside material available during this office visit and agree with the  radiology impressions.   Postinflammatory pulmonary fibrosis with autoimmune features   Pulmonary fibrosis with autoimmune features  Improved with mycophenolate which is now been authorized by the patient's insurance  Plan  Maintain mycophenolate 500 mg twice daily  Maintain low-dose prednisone per rheumatology  Follow-up again 4 months  Maintain oxygen therapy rest and exertion This patient continues to use and benefit from her oxygen therapy and has re qualified at this visit for continued oxygen therapy

## 2014-11-01 DIAGNOSIS — J8410 Pulmonary fibrosis, unspecified: Secondary | ICD-10-CM | POA: Diagnosis not present

## 2014-11-16 DIAGNOSIS — E785 Hyperlipidemia, unspecified: Secondary | ICD-10-CM | POA: Diagnosis not present

## 2014-11-16 DIAGNOSIS — E538 Deficiency of other specified B group vitamins: Secondary | ICD-10-CM | POA: Diagnosis not present

## 2014-11-16 DIAGNOSIS — M0579 Rheumatoid arthritis with rheumatoid factor of multiple sites without organ or systems involvement: Secondary | ICD-10-CM | POA: Diagnosis not present

## 2014-11-16 DIAGNOSIS — K210 Gastro-esophageal reflux disease with esophagitis: Secondary | ICD-10-CM | POA: Diagnosis not present

## 2014-11-16 DIAGNOSIS — C61 Malignant neoplasm of prostate: Secondary | ICD-10-CM | POA: Diagnosis not present

## 2014-11-16 DIAGNOSIS — Z139 Encounter for screening, unspecified: Secondary | ICD-10-CM | POA: Diagnosis not present

## 2014-11-16 DIAGNOSIS — J8410 Pulmonary fibrosis, unspecified: Secondary | ICD-10-CM | POA: Diagnosis not present

## 2014-11-16 DIAGNOSIS — E559 Vitamin D deficiency, unspecified: Secondary | ICD-10-CM | POA: Diagnosis not present

## 2014-11-16 DIAGNOSIS — Z79899 Other long term (current) drug therapy: Secondary | ICD-10-CM | POA: Diagnosis not present

## 2014-11-16 DIAGNOSIS — R7309 Other abnormal glucose: Secondary | ICD-10-CM | POA: Diagnosis not present

## 2014-11-16 DIAGNOSIS — H6121 Impacted cerumen, right ear: Secondary | ICD-10-CM | POA: Diagnosis not present

## 2014-11-16 DIAGNOSIS — M109 Gout, unspecified: Secondary | ICD-10-CM | POA: Diagnosis not present

## 2014-11-17 DIAGNOSIS — J849 Interstitial pulmonary disease, unspecified: Secondary | ICD-10-CM | POA: Diagnosis not present

## 2014-11-17 DIAGNOSIS — J8410 Pulmonary fibrosis, unspecified: Secondary | ICD-10-CM | POA: Diagnosis not present

## 2014-11-17 DIAGNOSIS — M069 Rheumatoid arthritis, unspecified: Secondary | ICD-10-CM | POA: Diagnosis not present

## 2014-11-17 DIAGNOSIS — J9611 Chronic respiratory failure with hypoxia: Secondary | ICD-10-CM | POA: Diagnosis not present

## 2014-12-02 DIAGNOSIS — J8410 Pulmonary fibrosis, unspecified: Secondary | ICD-10-CM | POA: Diagnosis not present

## 2014-12-17 DIAGNOSIS — J8410 Pulmonary fibrosis, unspecified: Secondary | ICD-10-CM | POA: Diagnosis not present

## 2014-12-17 DIAGNOSIS — M069 Rheumatoid arthritis, unspecified: Secondary | ICD-10-CM | POA: Diagnosis not present

## 2014-12-17 DIAGNOSIS — J849 Interstitial pulmonary disease, unspecified: Secondary | ICD-10-CM | POA: Diagnosis not present

## 2014-12-17 DIAGNOSIS — J9611 Chronic respiratory failure with hypoxia: Secondary | ICD-10-CM | POA: Diagnosis not present

## 2015-01-01 DIAGNOSIS — J8410 Pulmonary fibrosis, unspecified: Secondary | ICD-10-CM | POA: Diagnosis not present

## 2015-01-17 DIAGNOSIS — J9611 Chronic respiratory failure with hypoxia: Secondary | ICD-10-CM | POA: Diagnosis not present

## 2015-01-17 DIAGNOSIS — J849 Interstitial pulmonary disease, unspecified: Secondary | ICD-10-CM | POA: Diagnosis not present

## 2015-01-17 DIAGNOSIS — J8410 Pulmonary fibrosis, unspecified: Secondary | ICD-10-CM | POA: Diagnosis not present

## 2015-01-17 DIAGNOSIS — M069 Rheumatoid arthritis, unspecified: Secondary | ICD-10-CM | POA: Diagnosis not present

## 2015-02-01 DIAGNOSIS — J8410 Pulmonary fibrosis, unspecified: Secondary | ICD-10-CM | POA: Diagnosis not present

## 2015-02-13 DIAGNOSIS — J8410 Pulmonary fibrosis, unspecified: Secondary | ICD-10-CM | POA: Diagnosis not present

## 2015-02-13 DIAGNOSIS — Z23 Encounter for immunization: Secondary | ICD-10-CM | POA: Diagnosis not present

## 2015-02-13 DIAGNOSIS — K210 Gastro-esophageal reflux disease with esophagitis: Secondary | ICD-10-CM | POA: Diagnosis not present

## 2015-02-13 DIAGNOSIS — M109 Gout, unspecified: Secondary | ICD-10-CM | POA: Diagnosis not present

## 2015-02-13 DIAGNOSIS — R7309 Other abnormal glucose: Secondary | ICD-10-CM | POA: Diagnosis not present

## 2015-02-13 DIAGNOSIS — E559 Vitamin D deficiency, unspecified: Secondary | ICD-10-CM | POA: Diagnosis not present

## 2015-02-13 DIAGNOSIS — M0579 Rheumatoid arthritis with rheumatoid factor of multiple sites without organ or systems involvement: Secondary | ICD-10-CM | POA: Diagnosis not present

## 2015-02-13 DIAGNOSIS — Z79899 Other long term (current) drug therapy: Secondary | ICD-10-CM | POA: Diagnosis not present

## 2015-02-13 DIAGNOSIS — C61 Malignant neoplasm of prostate: Secondary | ICD-10-CM | POA: Diagnosis not present

## 2015-02-16 DIAGNOSIS — J8410 Pulmonary fibrosis, unspecified: Secondary | ICD-10-CM | POA: Diagnosis not present

## 2015-02-16 DIAGNOSIS — J9611 Chronic respiratory failure with hypoxia: Secondary | ICD-10-CM | POA: Diagnosis not present

## 2015-02-16 DIAGNOSIS — J849 Interstitial pulmonary disease, unspecified: Secondary | ICD-10-CM | POA: Diagnosis not present

## 2015-02-16 DIAGNOSIS — M069 Rheumatoid arthritis, unspecified: Secondary | ICD-10-CM | POA: Diagnosis not present

## 2015-03-03 ENCOUNTER — Telehealth: Payer: Self-pay | Admitting: Critical Care Medicine

## 2015-03-03 DIAGNOSIS — J8410 Pulmonary fibrosis, unspecified: Secondary | ICD-10-CM | POA: Diagnosis not present

## 2015-03-03 MED ORDER — PREDNISONE 10 MG PO TABS: ORAL_TABLET | ORAL | Status: DC

## 2015-03-03 MED ORDER — MYCOPHENOLATE MOFETIL 500 MG PO TABS: 500.0000 mg | ORAL_TABLET | Freq: Two times a day (BID) | ORAL | Status: DC

## 2015-03-03 NOTE — Telephone Encounter (Signed)
Per 10/25/14 OV w/ OV: Patient Instructions       Please discuss getting Prevnar 69 with your primary medical doctor   Consider a flu vaccine   No change in medications Return in 4 months    ---  Spoke with pt. He needs refill on prednisone and cellcept. cellcept last refilled 04/18/14 Take 1 tablet (500 mg total) by mouth 2 (two) times daily. #180 x 1 refill Prednisone last refilled 09/30/14 Take one daily #90 x 1 refill  Pt has not established with new provider yet. Please advise MW if okay to refill under your name?

## 2015-03-03 NOTE — Telephone Encounter (Signed)
Yes x one but be sure has new provider by the time this is out

## 2015-03-03 NOTE — Telephone Encounter (Signed)
Called spoke with pt. Medications refilled to the pharmacy. He is scheduled to see JN 2/7 at the [facility] office. Nothing further needed

## 2015-03-19 DIAGNOSIS — J9611 Chronic respiratory failure with hypoxia: Secondary | ICD-10-CM | POA: Diagnosis not present

## 2015-03-19 DIAGNOSIS — J8410 Pulmonary fibrosis, unspecified: Secondary | ICD-10-CM | POA: Diagnosis not present

## 2015-03-19 DIAGNOSIS — M069 Rheumatoid arthritis, unspecified: Secondary | ICD-10-CM | POA: Diagnosis not present

## 2015-03-19 DIAGNOSIS — J849 Interstitial pulmonary disease, unspecified: Secondary | ICD-10-CM | POA: Diagnosis not present

## 2015-04-03 DIAGNOSIS — J8410 Pulmonary fibrosis, unspecified: Secondary | ICD-10-CM | POA: Diagnosis not present

## 2015-04-11 ENCOUNTER — Ambulatory Visit: Payer: Medicare PPO | Admitting: Pulmonary Disease

## 2015-04-19 DIAGNOSIS — J8410 Pulmonary fibrosis, unspecified: Secondary | ICD-10-CM | POA: Diagnosis not present

## 2015-04-19 DIAGNOSIS — J9611 Chronic respiratory failure with hypoxia: Secondary | ICD-10-CM | POA: Diagnosis not present

## 2015-04-19 DIAGNOSIS — M069 Rheumatoid arthritis, unspecified: Secondary | ICD-10-CM | POA: Diagnosis not present

## 2015-04-19 DIAGNOSIS — J849 Interstitial pulmonary disease, unspecified: Secondary | ICD-10-CM | POA: Diagnosis not present

## 2015-05-02 DIAGNOSIS — J8410 Pulmonary fibrosis, unspecified: Secondary | ICD-10-CM | POA: Diagnosis not present

## 2015-05-04 ENCOUNTER — Ambulatory Visit: Payer: Medicare PPO | Admitting: Pulmonary Disease

## 2015-05-09 ENCOUNTER — Telehealth: Payer: Self-pay | Admitting: Pulmonary Disease

## 2015-05-09 NOTE — Telephone Encounter (Signed)
IMAGING CT CHEST W/O 12/31/13 (per radiologist):Numerous scattered lymph nodes in mediastinum including 1.2 cm short axis AP window lymph node. Coarse peripheral interstitial lung disease likely with early honeycombing. Underlying centrilobular emphysema. Nodule in left midlung. No other separate nodule identified. Thoracic spondylosis.  LABS 01/26/14 CK:  104 ESR:  87 ANA:  Negative Anti-CCP:  231.3 RF:  503

## 2015-05-10 ENCOUNTER — Ambulatory Visit (INDEPENDENT_AMBULATORY_CARE_PROVIDER_SITE_OTHER): Payer: Commercial Managed Care - HMO | Admitting: Pulmonary Disease

## 2015-05-10 ENCOUNTER — Encounter: Payer: Self-pay | Admitting: Pulmonary Disease

## 2015-05-10 VITALS — BP 124/70 | HR 68 | Ht 67.0 in | Wt 186.6 lb

## 2015-05-10 DIAGNOSIS — D849 Immunodeficiency, unspecified: Secondary | ICD-10-CM

## 2015-05-10 DIAGNOSIS — J849 Interstitial pulmonary disease, unspecified: Secondary | ICD-10-CM | POA: Diagnosis not present

## 2015-05-10 DIAGNOSIS — D899 Disorder involving the immune mechanism, unspecified: Secondary | ICD-10-CM | POA: Diagnosis not present

## 2015-05-10 DIAGNOSIS — J9611 Chronic respiratory failure with hypoxia: Secondary | ICD-10-CM | POA: Diagnosis not present

## 2015-05-10 DIAGNOSIS — R011 Cardiac murmur, unspecified: Secondary | ICD-10-CM

## 2015-05-10 DIAGNOSIS — Z23 Encounter for immunization: Secondary | ICD-10-CM | POA: Diagnosis not present

## 2015-05-10 DIAGNOSIS — K219 Gastro-esophageal reflux disease without esophagitis: Secondary | ICD-10-CM | POA: Diagnosis not present

## 2015-05-10 MED ORDER — SULFAMETHOXAZOLE-TRIMETHOPRIM 800-160 MG PO TABS
1.0000 | ORAL_TABLET | ORAL | Status: DC
Start: 2015-05-10 — End: 2016-08-27

## 2015-05-10 NOTE — Patient Instructions (Signed)
   Continue taking your mycophenolate (CellCept) & prednisone as prescribed  I'm starting you on Bactrim (trimethoprim/sulfamethoxazole) to prevent pneumonia. You will take one of these pills on Monday, one on Wednesday, and one on Friday. Please call me if you have any problems with this medication.  We will be doing a breathing and walking test at your next appointment to check your lung function  I'm doing an ultrasound of your heart (echocardiogram) to check the murmur that I heard on your exam today  I am ordering a special CT scan of your chest to see how much lung damage from your pulmonary fibrosis is present  I want to to have blood work done next week to check your kidney function on this new antibiotic. You should have blood work done every 3 months while you are on the CellCept (mycophenolate).  Please call me if you have any new breathing problems before your next appointment.  I will see back in 2-3 months or sooner if needed.   TESTS ORDERED: 1. BMP in 1 week  2. CMP & CBC every 3 month 3. Transthoracic echocardiogram 4. High-resolution CT chest without contrast 5. 6 minute walk test with oxygen titration at follow-up appointment 6. Full pulmonary function testing at next appointment

## 2015-05-10 NOTE — Progress Notes (Signed)
Subjective:    Patient ID: Jeff Lynch, male    DOB: [birth year], 75 y.o.   MRN: [id number]  C.C.:  Follow-up for ILD, Chronic Hypoxic Respiratory Failure, GERD, & Chronic Immunosuppression.  HPI ILD:  Patient's rheumatoid factor & anti-CCP were previously positive. Patient currently prescribed Cellcept and Prednisone 49m daily. He reports his dyspnea doesn't seem to be getting any worse or better. He does continue to have dyspnea on exertion. He admits he avoid stairs. He reports he is limited due to pain in his knees. He reports rare cough and wheezing.   Chronic Hypoxic Respiratory Failure: Patient inconsistently using oxygen with exertion. Previously prescribed oxygen at 2 L/m continuous with exertion.  GERD:  Currently prescribed Prilosec daily. He reports he uses the Prilosec intermittently. Rare dyspepsia. No morning brash water taste. No abdominal pain, nausea, or emesis.   Chronic Immunosuppression:  Currently on Prednisone & Cellcept. He has never been on PCP prophylaxis that he can recall.   Review of Systems He reports he has pain predominantly in his knees. He reports no swelling or erythema in his joints. No fever, chills, or sweats. He does have bruising of various ages. No rashes.   No Known Allergies  Current Outpatient Prescriptions on File Prior to Visit  Medication Sig Dispense Refill  . acetaminophen (TYLENOL) 325 MG tablet Take 325 mg by mouth every 6 (six) hours as needed. Reported on 05/10/2015    . allopurinol (ZYLOPRIM) 100 MG tablet Take 1 tablet by mouth daily.    .Marland Kitchenaspirin 81 MG tablet Take 81 mg by mouth daily.    . Cyanocobalamin (VITAMIN B-12 PO) Take 1 tablet by mouth daily.    . mycophenolate (CELLCEPT) 500 MG tablet Take 1 tablet (500 mg total) by mouth 2 (two) times daily. 180 tablet 0  . omeprazole (PRILOSEC) 20 MG capsule Take 1 capsule (20 mg total) by mouth daily. (Patient taking differently: Take 20 mg by mouth daily. As needed) 30 capsule 6  .  pravastatin (PRAVACHOL) 80 MG tablet Take 1 tablet by mouth daily.    . predniSONE (DELTASONE) 10 MG tablet Take one daily 90 tablet 0  . Vitamin D, Ergocalciferol, (DRISDOL) 50000 UNITS CAPS capsule Take 50,000 Units by mouth every 7 (seven) days.     No current facility-administered medications on file prior to visit.    Past Medical History  Diagnosis Date  . Prostate cancer ([facility]   . Vitamin D deficiency   . Gout   . Hyperlipemia   . SOB (shortness of breath)   . Cough   . Vitamin B12 deficiency   . Esophagitis, reflux   . Knee joint pain   . Cervical pain   . Impotence, organic   . Acute pain of right shoulder   . ILD (interstitial lung disease) ([facility]     Likely Secondary to RA  . Rheumatoid arthritis ([facility]   . Chronic respiratory failure with hypoxia ([facility]     Past Surgical History  Procedure Laterality Date  . Prostatectomy  2003  . Back surgery  1990    Family History  Problem Relation Age of Onset  . Liver disease Father   . Prostate cancer Brother   . Lung disease Neg Hx   . Rheumatologic disease Neg Hx     Social History   Social History  . Marital Status: Single    Spouse Name: N/A  . Number of Children: N/A  . Years of Education: N/A  Occupational History  . [employer]     Stocking  . [employer]     retired   Social History Main Topics  . Smoking status: Former Smoker -- 1.00 packs/day for 40 years    Types: Cigarettes    Quit date: 01/02/2002  . Smokeless tobacco: Never Used  . Alcohol Use: No  . Drug Use: No  . Sexual Activity: Not Asked   Other Topics Concern  . None   Social History Narrative   Originally from [place]. Always lived in [place]. Prior travel to [place], [place], [place], [place], & [place]. No international travel. 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 No known asbestos exposure. Remote exposure to birds/parakeets briefly. Has a dog currently. No mold exposure.       Objective:   Physical Exam BP 124/70 mmHg   Pulse 68  Ht '5\' 7"'  (1.702 m)  Wt 186 lb 9.6 oz (84.641 kg)  BMI 29.22 kg/m2  SpO2 96% General:  Awake. Alert. No acute distress. Mild central obesity.  Integument:  Warm & dry. No rash on exposed skin. Minor bruising on exposed upper extremities. HEENT:  Moist mucus membranes. No oral ulcers. No scleral injection or icterus. No nasal turbinate swelling Cardiovascular:  Regular rate. No edema. No appreciable JVD. 3/6 systolic murmur at aortic position. Pulmonary:  Good aeration bilaterally. Symmetric chest wall expansion. No accessory muscle use.Patient does have Velcro-like crackles in bilateral lungs both lower and mid.  Abdomen: Soft. Normal bowel soundTo breathrossly nontender. Musculoskeletal:  Normal bulk and tone. Hand grip strength 5/5 bilaterally. No joint  effusion appreciated. Patient does have significant synovial thickening of bilateral MCP, PIP, & DIP joints.  6MWT 08/23/14:  Nadir saturation 86% on RA - Saturated 93% on 2 L/m continuous  IMAGING CT CHEST W/O 12/31/13 (per radiologist):Numerous scattered lymph nodes in mediastinum including 1.2 cm short axis AP window lymph node. Coarse peripheral interstitial lung disease likely with early honeycombing. Underlying centrilobular emphysema. Nodule in left midlung. No other separate nodule identified. Thoracic spondylosis.  LABS 11/16/14 BMP: 140/4.5/101/23/11/0.79/119/9.2 LFT: 3.9/6.4/0.5/56/16/13 CBC: 9.0/14.8/45.2/199  01/26/14 CK: 104 ESR: 87 ANA: Negative Anti-CCP: 231.3 RF: 503    Assessment & Plan:  75 year old male with underlying interstitial lung disease presumably related to rheumatoid given serologies. patient currently is on immunosuppression with CellCept & prednisone without significant symptoms at this time. Additionally, he has no symptoms from his underlying reflux. I believe that given his immunosuppression he does need to be on PCP prophylaxis with Bactrim. With his inconsistent use of oxygen it's  difficult to know whether or not he truly has enough activity to warrant oxygen therapy. Has an additional factor he does have what sounds to be a systolic aortic murmur on physical exam today. The patient himself reports that he has not been told he had a murmur in the past. I instructed the patient to contact my office if he had any new breathing problems before his next appointment. We will need to further investigate his pulmonary function with imaging as well as functional testing. I will monitor his electrolytes and renal function closely as we start him on PCP prophylaxis with Bactrim.   1. ILD:  Minimal symptoms on Cellcept & Prednisone daily. Checking full pulmonary function testing at next appointment. 2. Chronic hypoxic respiratory failure: Continue oxygen as previously prescribed. Plan to check six-minute walk test with oxygen titration at next appointment. 3. Chronic immunosuppression:  Starting patient on Bactrim DS 1 tablet Monday, Wednesday, &  Friday. Plan to repeat BMP in 1 week. Continuing to monitor cell counts and electrolytes every 3 months thereafter. 4. GERD: Currently asymptomatic. Continue to use Prilosec when necessary. 5. Heart murmur: Checking transthoracic echocardiogram to better evaluate.  6. Health maintenance: Patient received influenza vaccine October 2016 & Pneumovax September 2015. Administering Prevnar vaccine today. 7. Follow-up: Patient to return to clinic in 2-3 months or sooner if needed.   Sonia Baller Ashok Cordia, M.D. [facility] Pager:  [phone] After 3pm or if no response, call [phone] 10:03 AM 05/10/2015

## 2015-05-10 NOTE — Addendum Note (Signed)
Addended by: Len Blalock on: 05/10/2015 10:25 AM   Modules accepted: Orders

## 2015-05-15 DIAGNOSIS — R739 Hyperglycemia, unspecified: Secondary | ICD-10-CM | POA: Diagnosis not present

## 2015-05-15 DIAGNOSIS — J8410 Pulmonary fibrosis, unspecified: Secondary | ICD-10-CM | POA: Diagnosis not present

## 2015-05-15 DIAGNOSIS — E785 Hyperlipidemia, unspecified: Secondary | ICD-10-CM | POA: Diagnosis not present

## 2015-05-17 DIAGNOSIS — J9611 Chronic respiratory failure with hypoxia: Secondary | ICD-10-CM | POA: Diagnosis not present

## 2015-05-17 DIAGNOSIS — M069 Rheumatoid arthritis, unspecified: Secondary | ICD-10-CM | POA: Diagnosis not present

## 2015-05-17 DIAGNOSIS — J849 Interstitial pulmonary disease, unspecified: Secondary | ICD-10-CM | POA: Diagnosis not present

## 2015-05-17 DIAGNOSIS — J8410 Pulmonary fibrosis, unspecified: Secondary | ICD-10-CM | POA: Diagnosis not present

## 2015-06-01 DIAGNOSIS — J8410 Pulmonary fibrosis, unspecified: Secondary | ICD-10-CM | POA: Diagnosis not present

## 2015-06-05 ENCOUNTER — Other Ambulatory Visit: Payer: Self-pay | Admitting: Internal Medicine

## 2015-06-09 ENCOUNTER — Inpatient Hospital Stay: Admission: RE | Admit: 2015-06-09 | Payer: Self-pay | Source: Ambulatory Visit

## 2015-06-09 ENCOUNTER — Other Ambulatory Visit (HOSPITAL_COMMUNITY): Payer: Self-pay

## 2015-06-17 DIAGNOSIS — J849 Interstitial pulmonary disease, unspecified: Secondary | ICD-10-CM | POA: Diagnosis not present

## 2015-06-17 DIAGNOSIS — J8410 Pulmonary fibrosis, unspecified: Secondary | ICD-10-CM | POA: Diagnosis not present

## 2015-06-17 DIAGNOSIS — M069 Rheumatoid arthritis, unspecified: Secondary | ICD-10-CM | POA: Diagnosis not present

## 2015-06-17 DIAGNOSIS — J9611 Chronic respiratory failure with hypoxia: Secondary | ICD-10-CM | POA: Diagnosis not present

## 2015-06-26 ENCOUNTER — Ambulatory Visit (HOSPITAL_COMMUNITY): Payer: Commercial Managed Care - HMO | Attending: Cardiovascular Disease

## 2015-06-26 ENCOUNTER — Other Ambulatory Visit: Payer: Self-pay

## 2015-06-26 ENCOUNTER — Ambulatory Visit (INDEPENDENT_AMBULATORY_CARE_PROVIDER_SITE_OTHER)
Admission: RE | Admit: 2015-06-26 | Discharge: 2015-06-26 | Disposition: A | Discharge status: Home / Self Care | Payer: Commercial Managed Care - HMO | Source: Ambulatory Visit | Attending: Pulmonary Disease | Admitting: Pulmonary Disease

## 2015-06-26 DIAGNOSIS — R011 Cardiac murmur, unspecified: Secondary | ICD-10-CM | POA: Diagnosis not present

## 2015-06-26 DIAGNOSIS — I517 Cardiomegaly: Secondary | ICD-10-CM | POA: Diagnosis not present

## 2015-06-26 DIAGNOSIS — J849 Interstitial pulmonary disease, unspecified: Secondary | ICD-10-CM

## 2015-06-26 DIAGNOSIS — R918 Other nonspecific abnormal finding of lung field: Secondary | ICD-10-CM | POA: Diagnosis not present

## 2015-06-26 DIAGNOSIS — I352 Nonrheumatic aortic (valve) stenosis with insufficiency: Secondary | ICD-10-CM | POA: Diagnosis not present

## 2015-06-26 DIAGNOSIS — I059 Rheumatic mitral valve disease, unspecified: Secondary | ICD-10-CM | POA: Insufficient documentation

## 2015-07-02 DIAGNOSIS — J8410 Pulmonary fibrosis, unspecified: Secondary | ICD-10-CM | POA: Diagnosis not present

## 2015-07-05 ENCOUNTER — Telehealth: Payer: Self-pay | Admitting: Pulmonary Disease

## 2015-07-05 DIAGNOSIS — R011 Cardiac murmur, unspecified: Secondary | ICD-10-CM

## 2015-07-05 NOTE — Telephone Encounter (Signed)
atc pt X2, line rang to fast busy signal.  Wcb.

## 2015-07-06 NOTE — Telephone Encounter (Signed)
Notes Recorded by Javier Glazier, MD on 06/28/2015 at 6:03 PM Please call the patient and let him know I reviewed his echocardiogram results. There is evidence that his aortic valve in his heart may be a little stiff and that's what's causing the murmur I heard during his exam. I don't see where he's see a Cardiologist and if that is the case then please refer him so that they can evaluate & provide further recommendations. Dr. Jenkins Rouge read the echo so that would be the best one unless he's too booked out or the patient has another preference. I will address his CT scan at his follow-up appointment with me. Thank you.   Details        Called spoke with pt. Reviewed results and recs. Pt states he does not see a cardiologist at this time. I explained to him that I would send a referral. He voiced understanding and had no further questions. Referral has been placed. Nothing further needed.

## 2015-07-06 NOTE — Progress Notes (Signed)
Quick Note:  Called spoke with pt. Reviewed results and recs. Pt states he does not see a cardiologist at this time. I placed the referral. He voiced understanding and nothing further is needed. ______

## 2015-07-07 ENCOUNTER — Other Ambulatory Visit: Payer: Self-pay | Admitting: Pulmonary Disease

## 2015-07-07 DIAGNOSIS — R011 Cardiac murmur, unspecified: Secondary | ICD-10-CM

## 2015-07-10 ENCOUNTER — Telehealth: Payer: Self-pay | Admitting: *Deleted

## 2015-07-10 NOTE — Telephone Encounter (Signed)
-----   Message from Javier Glazier, MD sent at 07/06/2015  6:05 PM EDT ----- Patient would like a referral to an [place] per the note from G. Rosana Hoes with Cardiology. Please call and see if there is one he would prefer.  Thanks.

## 2015-07-10 NOTE — Telephone Encounter (Signed)
Patient states that he does not have a preference as long as it is in [place].  Patient has already been scheduled for 6/1 at 8:30 with Dr Jimmie Molly in [place]. Patient is aware of appointment. Nothing further needed.

## 2015-07-11 DIAGNOSIS — M109 Gout, unspecified: Secondary | ICD-10-CM | POA: Diagnosis not present

## 2015-07-11 DIAGNOSIS — Z6828 Body mass index (BMI) 28.0-28.9, adult: Secondary | ICD-10-CM | POA: Diagnosis not present

## 2015-07-11 DIAGNOSIS — E785 Hyperlipidemia, unspecified: Secondary | ICD-10-CM | POA: Diagnosis not present

## 2015-07-11 DIAGNOSIS — M25561 Pain in right knee: Secondary | ICD-10-CM | POA: Diagnosis not present

## 2015-07-11 DIAGNOSIS — E538 Deficiency of other specified B group vitamins: Secondary | ICD-10-CM | POA: Diagnosis not present

## 2015-07-11 DIAGNOSIS — K210 Gastro-esophageal reflux disease with esophagitis: Secondary | ICD-10-CM | POA: Diagnosis not present

## 2015-07-11 DIAGNOSIS — J8410 Pulmonary fibrosis, unspecified: Secondary | ICD-10-CM | POA: Diagnosis not present

## 2015-07-11 DIAGNOSIS — C61 Malignant neoplasm of prostate: Secondary | ICD-10-CM | POA: Diagnosis not present

## 2015-07-11 DIAGNOSIS — E663 Overweight: Secondary | ICD-10-CM | POA: Diagnosis not present

## 2015-07-17 ENCOUNTER — Ambulatory Visit (INDEPENDENT_AMBULATORY_CARE_PROVIDER_SITE_OTHER): Payer: Commercial Managed Care - HMO | Admitting: Pulmonary Disease

## 2015-07-17 DIAGNOSIS — J9611 Chronic respiratory failure with hypoxia: Secondary | ICD-10-CM | POA: Diagnosis not present

## 2015-07-17 DIAGNOSIS — R0600 Dyspnea, unspecified: Secondary | ICD-10-CM

## 2015-07-17 DIAGNOSIS — J849 Interstitial pulmonary disease, unspecified: Secondary | ICD-10-CM | POA: Diagnosis not present

## 2015-07-17 DIAGNOSIS — M069 Rheumatoid arthritis, unspecified: Secondary | ICD-10-CM | POA: Diagnosis not present

## 2015-07-17 DIAGNOSIS — J8410 Pulmonary fibrosis, unspecified: Secondary | ICD-10-CM | POA: Diagnosis not present

## 2015-07-17 LAB — PULMONARY FUNCTION TEST
DL/VA % PRED: 67 %
DL/VA: 2.92 ml/min/mmHg/L
DLCO COR: 11.13 ml/min/mmHg
DLCO cor % pred: 41 %
DLCO unc % pred: 43 %
DLCO unc: 11.63 ml/min/mmHg
FEF 25-75 Post: 2.84 L/sec
FEF 25-75 Pre: 2.42 L/sec
FEF2575-%Change-Post: 17 %
FEF2575-%PRED-PRE: 128 %
FEF2575-%Pred-Post: 150 %
FEV1-%Change-Post: 2 %
FEV1-%PRED-PRE: 84 %
FEV1-%Pred-Post: 86 %
FEV1-POST: 2.23 L
FEV1-PRE: 2.18 L
FEV1FVC-%Change-Post: 2 %
FEV1FVC-%Pred-Pre: 113 %
FEV6-%CHANGE-POST: 1 %
FEV6-%PRED-PRE: 77 %
FEV6-%Pred-Post: 78 %
FEV6-POST: 2.64 L
FEV6-PRE: 2.6 L
FEV6FVC-%Change-Post: 0 %
FEV6FVC-%PRED-POST: 107 %
FEV6FVC-%PRED-PRE: 106 %
FVC-%CHANGE-POST: 0 %
FVC-%PRED-POST: 73 %
FVC-%PRED-PRE: 73 %
FVC-POST: 2.65 L
FVC-PRE: 2.65 L
POST FEV6/FVC RATIO: 100 %
PRE FEV1/FVC RATIO: 83 %
Post FEV1/FVC ratio: 84 %
Pre FEV6/FVC Ratio: 99 %
RV % PRED: 56 %
RV: 1.31 L
TLC % PRED: 65 %
TLC: 4.09 L

## 2015-07-17 NOTE — Progress Notes (Signed)
PFT done today. 

## 2015-07-18 NOTE — Progress Notes (Signed)
PFT 07/17/15: FVC 2.65 L (73%) FEV1 2.18 L (84%) FEV1/FVC 0.83 FEF 25-75 2.42 L (128%) no bronchodilator response TLC 4.09 L (65%) RV 56% ERV 63% DLCO corrected 41% (Hgb 16.3)  6MWT 07/17/15:  192 meters / Baseline sat 95% on RA / Nadir Sat 82% on RA at End of Walk (required 2 L/m to maintain w/ exertion)

## 2015-07-24 ENCOUNTER — Telehealth: Payer: Self-pay | Admitting: Pulmonary Disease

## 2015-07-24 DIAGNOSIS — J8410 Pulmonary fibrosis, unspecified: Secondary | ICD-10-CM

## 2015-07-24 MED ORDER — MYCOPHENOLATE MOFETIL 500 MG PO TABS: 500.0000 mg | ORAL_TABLET | Freq: Two times a day (BID) | ORAL | Status: DC

## 2015-07-24 NOTE — Telephone Encounter (Signed)
Patient calling to get refill of CellCept sent to mail order. Rx sent to pharmacy. Patient aware. Nothing further needed.   mycophenolate (CELLCEPT) 500 MG tablet 180 tablet 1 07/24/2015       Sig - Route: Take 1 tablet (500 mg total) by mouth 2 (two) times daily. - Oral     E-Prescribing Status: Receipt confirmed by pharmacy (07/24/2015 1:52 PM EDT)

## 2015-07-28 ENCOUNTER — Telehealth: Payer: Self-pay | Admitting: Pulmonary Disease

## 2015-07-28 DIAGNOSIS — J8410 Pulmonary fibrosis, unspecified: Secondary | ICD-10-CM

## 2015-07-28 NOTE — Telephone Encounter (Signed)
Attempted to contact pt. No answer, no option to leave a message. Will try back.  

## 2015-08-01 ENCOUNTER — Telehealth: Payer: Self-pay | Admitting: Pulmonary Disease

## 2015-08-01 DIAGNOSIS — J8410 Pulmonary fibrosis, unspecified: Secondary | ICD-10-CM | POA: Diagnosis not present

## 2015-08-01 MED ORDER — MYCOPHENOLATE MOFETIL 500 MG PO TABS: 500.0000 mg | ORAL_TABLET | Freq: Two times a day (BID) | ORAL | Status: DC

## 2015-08-01 NOTE — Telephone Encounter (Signed)
Patient calling back stating that we need to contact [employer] at the number listed below to get authorization.  Called [employer] this morning and they advised me that they did not need any information from [place] that patient has not met his deductible so he would have to pay the $87 copay [facility] again and they advised me that this drug does not qualify for Tier exception. Spoke with pharmacist at specialty pharmacy to see if there is anything that can be done to help with CoPay assistance for this patient. Patient assistance - [phone] - they advised me that I would have to speak with Pharmacy review to get Tier Exception.  Advised them that Customer Service told me that this drug does not qualify for tier exception.  Patient assistance stated that this was not true, that patient had reduced the tier before and it dropped his copay to $0.   Clinical Pharmacy Review- [phone] - transferred to this department to get Tier Exception form faxed to Dr. Ashok Cordia so patient can get his medications for free. Spoke with Clinical pharmacy review team and they are faxing form. Called and advised patient that we will be completing the form and will let him know when we have heard back regarding status of tier exception. Reference No. [id number]  Hold in triage until done.

## 2015-08-01 NOTE — Telephone Encounter (Signed)
Patient states that Dr. Joya Gaskins sent information to [facility] before that assisted with the copay for CellCept.  He said that right now, Jeff Lynch is charging him $87 CoPay for his CellCept and he wanted to have this information sent to [facility] again to avoid having this copay.  Not sure what information Dr. Joya Gaskins sent to [facility] in the past, do not see communications in patient's chart.  Called [employer] to find out what information they needed from our office.  They indicated that patient has not met his deductible and that is why his copay is $87.    Called and advised patient of [employer]'s response. Nothing further needed.

## 2015-08-03 DIAGNOSIS — J8489 Other specified interstitial pulmonary diseases: Secondary | ICD-10-CM | POA: Diagnosis not present

## 2015-08-03 DIAGNOSIS — M069 Rheumatoid arthritis, unspecified: Secondary | ICD-10-CM | POA: Diagnosis not present

## 2015-08-03 DIAGNOSIS — I2510 Atherosclerotic heart disease of native coronary artery without angina pectoris: Secondary | ICD-10-CM | POA: Diagnosis not present

## 2015-08-03 DIAGNOSIS — M358 Other specified systemic involvement of connective tissue: Secondary | ICD-10-CM | POA: Diagnosis not present

## 2015-08-03 DIAGNOSIS — E785 Hyperlipidemia, unspecified: Secondary | ICD-10-CM | POA: Diagnosis not present

## 2015-08-03 DIAGNOSIS — R0609 Other forms of dyspnea: Secondary | ICD-10-CM | POA: Diagnosis not present

## 2015-08-03 DIAGNOSIS — I350 Nonrheumatic aortic (valve) stenosis: Secondary | ICD-10-CM | POA: Diagnosis not present

## 2015-08-04 NOTE — Telephone Encounter (Signed)
Tier exception printed off of [place] website, filled out to best of my ability, and placed in JN's look-at folder for completion.   Will forward message to Mindy's box for follow up

## 2015-08-07 ENCOUNTER — Encounter: Payer: Self-pay | Admitting: Pulmonary Disease

## 2015-08-07 ENCOUNTER — Ambulatory Visit (INDEPENDENT_AMBULATORY_CARE_PROVIDER_SITE_OTHER): Payer: Commercial Managed Care - HMO | Admitting: Pulmonary Disease

## 2015-08-07 ENCOUNTER — Telehealth: Payer: Self-pay | Admitting: Pulmonary Disease

## 2015-08-07 VITALS — BP 132/80 | HR 99 | Ht 67.5 in | Wt 185.6 lb

## 2015-08-07 DIAGNOSIS — K219 Gastro-esophageal reflux disease without esophagitis: Secondary | ICD-10-CM

## 2015-08-07 DIAGNOSIS — D849 Immunodeficiency, unspecified: Secondary | ICD-10-CM

## 2015-08-07 DIAGNOSIS — J9611 Chronic respiratory failure with hypoxia: Secondary | ICD-10-CM | POA: Diagnosis not present

## 2015-08-07 DIAGNOSIS — D899 Disorder involving the immune mechanism, unspecified: Secondary | ICD-10-CM

## 2015-08-07 DIAGNOSIS — I350 Nonrheumatic aortic (valve) stenosis: Secondary | ICD-10-CM | POA: Insufficient documentation

## 2015-08-07 DIAGNOSIS — J849 Interstitial pulmonary disease, unspecified: Secondary | ICD-10-CM

## 2015-08-07 NOTE — Patient Instructions (Addendum)
   Continue taking your Cellcept & Prednisone as prescribed.  Continue taking your Bactrim as prescribed.  We will repeat a breathing & walking test at your next appointment.  I will see you back in 3 months or sooner if needed.  TESTS ORDERED: 1. Spirometry with DLCO at next appointment 2. Six-minute walk test with oxygen titration at next appointment

## 2015-08-07 NOTE — Telephone Encounter (Signed)
atc [employer] at the number provided, I could not get through this line without a direct extension which was not provided. Called [employer] at [phone], was then transferred to another department within [facility]- states that this is being sent to clinical review.   This is being followed through previous phone message.  Will close this encounter.

## 2015-08-07 NOTE — Progress Notes (Signed)
 Subjective:    Patient ID: Jeff Lynch, male    DOB: 12/24/1940, 74 y.o.   MRN: [id number]  C.C.:  Follow-up for ILD, Chronic Hypoxic Respiratory Failure, GERD, & Chronic Immunosuppression.  HPI ILD:  Patient's rheumatoid factor & anti-CCP were previously positive. Patient currently prescribed Cellcept and Prednisone 10mg daily. Reports his dyspnea is largely unchanged. He reports intermittent cough that is only rarely productive.  Chronic Hypoxic Respiratory Failure:  Prescribed oxygen at 2 L/m with exertion previously. His walk test in May confirmed only a 2 L/m requirement with exertion.  GERD:  Currently on Prilosec intermittently. He reports it is rare and seems to be diet induced. No dysphagia or odynophagia.   Chronic Immunosuppression:  Currently on Prednisone & Cellcept. Started Bactrim prophylaxis at last appointment. He denies any new rashes or bruising on this medication.  Review of Systems No fever, chills, or sweats. He reports chest discomfort with exertion that resolves quickly with rest. He notices this more when he exerts himself off oxygen. He is scheduled with a stress test on Thursday. No nausea, emesis, or diarrhea.   No Known Allergies  Current Outpatient Prescriptions on File Prior to Visit  Medication Sig Dispense Refill  . acetaminophen (TYLENOL) 325 MG tablet Take 325 mg by mouth every 6 (six) hours as needed. Reported on 05/10/2015    . allopurinol (ZYLOPRIM) 100 MG tablet Take 1 tablet by mouth daily.    . aspirin 81 MG tablet Take 81 mg by mouth daily.    . CALCIUM PO Take by mouth 2 (two) times daily.    . Cyanocobalamin (VITAMIN B-12 PO) Take 1 tablet by mouth daily.    . mycophenolate (CELLCEPT) 500 MG tablet Take 1 tablet (500 mg total) by mouth 2 (two) times daily. 60 tablet 0  . omeprazole (PRILOSEC) 20 MG capsule Take 1 capsule (20 mg total) by mouth daily. (Patient taking differently: Take 20 mg by mouth daily as needed. As needed) 30 capsule 6  .  pravastatin (PRAVACHOL) 80 MG tablet Take 1 tablet by mouth daily.    . predniSONE (DELTASONE) 10 MG tablet TAKE 1 TABLET EVERY DAY 90 tablet 0  . sulfamethoxazole-trimethoprim (BACTRIM DS,SEPTRA DS) 800-160 MG tablet Take 1 tablet by mouth 3 (three) times a week. On Mon, Wed, Friday 12 tablet 3  . Vitamin D, Ergocalciferol, (DRISDOL) 50000 UNITS CAPS capsule Take 50,000 Units by mouth every 7 (seven) days. Reported on 08/07/2015     No current facility-administered medications on file prior to visit.    Past Medical History  Diagnosis Date  . Prostate cancer (HCC)   . Vitamin D deficiency   . Gout   . Hyperlipemia   . SOB (shortness of breath)   . Cough   . Vitamin B12 deficiency   . Esophagitis, reflux   . Knee joint pain   . Cervical pain   . Impotence, organic   . Acute pain of right shoulder   . ILD (interstitial lung disease) (HCC)     Likely Secondary to RA  . Rheumatoid arthritis (HCC)   . Chronic respiratory failure with hypoxia (HCC)     Past Surgical History  Procedure Laterality Date  . Prostatectomy  2003  . Back surgery  1990    Family History  Problem Relation Age of Onset  . Liver disease Father   . Prostate cancer Brother   . Lung disease Neg Hx   . Rheumatologic disease Neg Hx       Social History   Social History  . Marital Status: Single    Spouse Name: N/A  . Number of Children: N/A  . Years of Education: N/A   Occupational History  . [employer]     Stocking  . [employer]     retired   Social History Main Topics  . Smoking status: Former Smoker -- 1.00 packs/day for 40 years    Types: Cigarettes    Quit date: 01/02/2002  . Smokeless tobacco: Never Used  . Alcohol Use: No  . Drug Use: No  . Sexual Activity: Not Asked   Other Topics Concern  . None   Social History Narrative   Originally from [place]. Always lived in [place]. Prior travel to [place], [place], [place], [place], & [place]. No international travel. 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 No known asbestos exposure. Remote exposure to birds/parakeets briefly. Has a dog currently. No mold exposure.       Objective:   Physical Exam BP 132/80 mmHg  Pulse 99  Ht 5' 7.5" (1.715 m)  Wt 185 lb 9.6 oz (84.188 kg)  BMI 28.62 kg/m2  SpO2 96% General:  Awake. Alert. No distress. Mild central obesity.  Integument:  Warm & dry. No rash on exposed skin. Using all extremities noted. HEENT:  Moist mucus membranes. No oral ulcers. No scleral injection. Mild bilateral nasal turbinate swelling. Cardiovascular:  Regular rate. Trace lower extremity edema. 3/6 systolic murmur at aortic position. Pulmonary:  Velcro crackles in the mid and lower lung zones predominantly. Normal work of breathing on room air. Good aeration bilaterally.  Abdomen: Soft. Normal bowel sounds. Grossly nontender. Musculoskeletal:  Normal bulk and tone. Hand grip strength 5/5 bilaterally. No joint  effusion appreciated.   PFT 07/17/15: FVC 2.65 L (73%) FEV1 2.18 L (84%) FEV1/FVC 0.83 FEF 25-75 2.42 L (128%) no bronchodilator response TLC 4.09 L (65%) RV 56% ERV 63% DLCO corrected 41% (Hgb 16.3)  6MWT 07/17/15: 192 meters / Baseline Sat 95% on RA / Nadir Sat 82% on RA at End of Walk (required 2 L/m to maintain w/ exertion) 08/23/14:  Nadir saturation 86% on RA - Saturated 93% on 2 L/m continuous  IMAGING HRCT CHEST W/O 06/26/15 (personally reviewed by me): Borderline enlarged mediastinal lymph nodes measuring up to 1.2 cm in short axis. Slightly basilar predominant subpleural reticular changes with some traction bronchiectasis and minimal groundglass. No appreciable honeycombing changes. No other parenchymal nodule or opacity appreciated. Centrilobular emphysema is also present. No pleural effusion or thickening. No pericardial effusion.  CT CHEST W/O 12/31/13 (per radiologist):Numerous scattered lymph nodes in mediastinum including 1.2 cm short axis AP window lymph node. Coarse peripheral  interstitial lung disease likely with early honeycombing. Underlying centrilobular emphysema. Nodule in left midlung. No other separate nodule identified. Thoracic spondylosis.  CARDIAC TTE (06/26/15): Mild LVH. EF 60-65%. LA mildly dilated. Mild to moderate aortic stenosis with mild regurgitation. Trivial mitral regurgitation. Left atrium mildly dilated.  LABS 05/15/15 CBC: 10.6/15.7/46.5/250 BMP: 139/4.8/98/28/14/1.02/117/10.3 LFT: 4.1/7.4/0.5/69/15/13  11/16/14 BMP: 140/4.5/101/23/11/0.79/119/9.2 LFT: 3.9/6.4/0.5/56/16/13 CBC: 9.0/14.8/45.2/199  01/26/14 CK: 104 ESR: 87 ANA: Negative Anti-CCP: 231.3 RF: 503    Assessment & Plan:  74-year-old male with underlying interstitial lung disease presumably related to rheumatoid given serologies. Continuing on current immunosuppression with CellCept & prednisone. Patient has excellent tolerance. Oxygen requirement seems to have remained stable therefore I feel no further escalation in immunosuppression is necessary. Patient has no adverse effects from his immunosuppressive medications or from his PCP   prophylaxis. He does have repeat blood work pending to monitor for hepatic and bone marrow toxicities on Monday. Plan to repeat spirometry as well as a walk test at his next appointment to further trend his pulmonary function. Advised the patient that he could potentially undergo knee replacement surgery with a spinal epidural as anesthetic. Instructed the patient to contact my office if he had any new breathing problems or questions before his next appointment.  1. ILD:  Patient has repeat blood work scheduled on Monday. Continuing Cellcept & Prednisone as prescribed. Repeat Spirometry with DLCO at next appointment. 2. Chronic hypoxic respiratory failure: Continuing on oxygen at 2 L/m with exertion as prescribed. Repeat 6MWT with oxygen titration at next appointment. 3. Chronic immunosuppression:  Continuing Bactrim DS on Monday, Wednesday, &  Friday for PCP prophylaxis. Checking serum CMP & CBC today. 4. GERD: Currently asymptomatic. Continue to use Prilosec when necessary.  5. Health maintenance: Patient received influenza vaccine October 2016, Pneumovax September 2015, & Prevnar March 2017. 6. Follow-up: Patient to return to clinic in 3 months or sooner if needed.    E. , M.D. [facility] Pager:  [phone] After 3pm or if no response, call [phone] 9:42 AM 08/07/2015    

## 2015-08-07 NOTE — Telephone Encounter (Signed)
Tier exception form completed and signed by JN.  It was faxed to [facility] at [phone] along with last OV note.  Form placed in JN's to do area for follow up.  Pt aware this has been done.

## 2015-08-10 DIAGNOSIS — I350 Nonrheumatic aortic (valve) stenosis: Secondary | ICD-10-CM | POA: Diagnosis not present

## 2015-08-14 NOTE — Telephone Encounter (Signed)
We have not received anything back on Tier Exception yet. Will await decision.

## 2015-08-15 ENCOUNTER — Telehealth: Payer: Self-pay | Admitting: Pulmonary Disease

## 2015-08-15 ENCOUNTER — Encounter: Payer: Self-pay | Admitting: Pulmonary Disease

## 2015-08-15 DIAGNOSIS — E785 Hyperlipidemia, unspecified: Secondary | ICD-10-CM | POA: Diagnosis not present

## 2015-08-15 DIAGNOSIS — R7309 Other abnormal glucose: Secondary | ICD-10-CM | POA: Diagnosis not present

## 2015-08-15 DIAGNOSIS — K210 Gastro-esophageal reflux disease with esophagitis: Secondary | ICD-10-CM | POA: Diagnosis not present

## 2015-08-15 DIAGNOSIS — E559 Vitamin D deficiency, unspecified: Secondary | ICD-10-CM | POA: Diagnosis not present

## 2015-08-15 DIAGNOSIS — E538 Deficiency of other specified B group vitamins: Secondary | ICD-10-CM | POA: Diagnosis not present

## 2015-08-15 DIAGNOSIS — M109 Gout, unspecified: Secondary | ICD-10-CM | POA: Diagnosis not present

## 2015-08-15 DIAGNOSIS — J8410 Pulmonary fibrosis, unspecified: Secondary | ICD-10-CM | POA: Diagnosis not present

## 2015-08-15 DIAGNOSIS — M069 Rheumatoid arthritis, unspecified: Secondary | ICD-10-CM | POA: Diagnosis not present

## 2015-08-15 DIAGNOSIS — C61 Malignant neoplasm of prostate: Secondary | ICD-10-CM | POA: Diagnosis not present

## 2015-08-15 NOTE — Telephone Encounter (Signed)
Spoke with the pt  He states that he received letter stating that cellcept was denied  He is asking if JN would be willing to write letter of necc for appeals process  Please advise, thanks

## 2015-08-15 NOTE — Telephone Encounter (Signed)
The letter is complete. Please print it and place it my my box for my signature. I will sign it on Monday but if it needs to be signed sooner then let me know and I'll come by tomorrow. Thanks.

## 2015-08-16 NOTE — Telephone Encounter (Signed)
See 6.13.17 phone note Will sign off

## 2015-08-16 NOTE — Telephone Encounter (Signed)
Pt aware and states that Monday 08/21/15 is okay, states that he has plenty on hand to last him.  Letter printed and placed in [place] look at to sign. Please advise Dr Ashok Cordia. Thanks.   Will hold in triage to ensure this is taken care of.

## 2015-08-17 DIAGNOSIS — J849 Interstitial pulmonary disease, unspecified: Secondary | ICD-10-CM | POA: Diagnosis not present

## 2015-08-17 DIAGNOSIS — M069 Rheumatoid arthritis, unspecified: Secondary | ICD-10-CM | POA: Diagnosis not present

## 2015-08-17 DIAGNOSIS — J9611 Chronic respiratory failure with hypoxia: Secondary | ICD-10-CM | POA: Diagnosis not present

## 2015-08-17 DIAGNOSIS — J8410 Pulmonary fibrosis, unspecified: Secondary | ICD-10-CM | POA: Diagnosis not present

## 2015-08-21 NOTE — Telephone Encounter (Signed)
Yes, I have the letter Don't see where the letter needs to be sent to?

## 2015-08-21 NOTE — Telephone Encounter (Signed)
JN have you signed this letter?  Thanks!

## 2015-08-21 NOTE — Telephone Encounter (Signed)
Pt calling back w/fax # [phone].Jeff Lynch

## 2015-08-21 NOTE — Telephone Encounter (Signed)
Yep. Left in Jeff Lynch's spot.  Creig Hines

## 2015-08-21 NOTE — Telephone Encounter (Signed)
Jeff Lynch, has this been taken care of? Please advise.

## 2015-08-21 NOTE — Telephone Encounter (Signed)
Per chart notes it looks like it needs to be sent to [place] for pt's Cellcept denial. Pt states that denial letter he received indicates that the [place] Review team needs to be contacted.   Spoke with rep @ [place] Review team and PA was initiated. They will fax determination in 24-72 hrs. If it is denied, then letter may need to be sent.   Routing to [place] for follow up.

## 2015-08-21 NOTE — Telephone Encounter (Signed)
Will send to [employer] as she has the letter to be faxed. Below if the fax number to fax the letter to.

## 2015-08-23 NOTE — Telephone Encounter (Signed)
Letter faxed to the fax number provided Pt is aware Will close message and await decision

## 2015-08-23 NOTE — Telephone Encounter (Signed)
Has this been faxed? Thanks.

## 2015-09-01 DIAGNOSIS — J8410 Pulmonary fibrosis, unspecified: Secondary | ICD-10-CM | POA: Diagnosis not present

## 2015-09-11 ENCOUNTER — Telehealth: Payer: Self-pay | Admitting: Pulmonary Disease

## 2015-09-11 DIAGNOSIS — J8410 Pulmonary fibrosis, unspecified: Secondary | ICD-10-CM

## 2015-09-11 MED ORDER — PREDNISONE 10 MG PO TABS: 10.0000 mg | ORAL_TABLET | Freq: Every day | ORAL | Status: DC

## 2015-09-11 MED ORDER — MYCOPHENOLATE MOFETIL 500 MG PO TABS: 500.0000 mg | ORAL_TABLET | Freq: Two times a day (BID) | ORAL | Status: DC

## 2015-09-11 NOTE — Telephone Encounter (Signed)
Last ov with JN on 08/07/15  Patient Instructions        Continue taking your Cellcept & Prednisone as prescribed.  Continue taking your Bactrim as prescribed.  We will repeat a breathing & walking test at your next appointment.  I will see you back in 3 months or sooner if needed.  TESTS ORDERED: 1. Spirometry with DLCO at next appointment 2. Six-minute walk test with oxygen titration at next appointment   Called spoke with pt. He is requested a refill on his Cellcept and prednisone be sent to [facility]. I explained to him that I would send the rx in today. He states that the cellcept is now covered under his insurance.  He voiced understanding and had no further questions. Both Rx's have been sent. Nothing further needed.

## 2015-09-16 DIAGNOSIS — M069 Rheumatoid arthritis, unspecified: Secondary | ICD-10-CM | POA: Diagnosis not present

## 2015-09-16 DIAGNOSIS — J8410 Pulmonary fibrosis, unspecified: Secondary | ICD-10-CM | POA: Diagnosis not present

## 2015-09-16 DIAGNOSIS — J9611 Chronic respiratory failure with hypoxia: Secondary | ICD-10-CM | POA: Diagnosis not present

## 2015-09-16 DIAGNOSIS — J849 Interstitial pulmonary disease, unspecified: Secondary | ICD-10-CM | POA: Diagnosis not present

## 2015-10-01 DIAGNOSIS — J8410 Pulmonary fibrosis, unspecified: Secondary | ICD-10-CM | POA: Diagnosis not present

## 2015-10-04 ENCOUNTER — Other Ambulatory Visit: Payer: Self-pay | Admitting: Pulmonary Disease

## 2015-10-04 DIAGNOSIS — J8410 Pulmonary fibrosis, unspecified: Secondary | ICD-10-CM

## 2015-10-10 ENCOUNTER — Telehealth: Payer: Self-pay | Admitting: Pulmonary Disease

## 2015-10-10 NOTE — Telephone Encounter (Signed)
Patient returned call, CB is [phone].

## 2015-10-10 NOTE — Telephone Encounter (Signed)
lmtcb X1 for pt  

## 2015-10-10 NOTE — Telephone Encounter (Signed)
Pt states that the last 2 months he has only gotten 1 month supply of Cellcept. He usually gets 90d at a time. He will call back in late August to have 90d rx sent in so that will get him to end of year. Nothing further needed.

## 2015-10-17 DIAGNOSIS — J9611 Chronic respiratory failure with hypoxia: Secondary | ICD-10-CM | POA: Diagnosis not present

## 2015-10-17 DIAGNOSIS — M069 Rheumatoid arthritis, unspecified: Secondary | ICD-10-CM | POA: Diagnosis not present

## 2015-10-17 DIAGNOSIS — J8410 Pulmonary fibrosis, unspecified: Secondary | ICD-10-CM | POA: Diagnosis not present

## 2015-10-17 DIAGNOSIS — J849 Interstitial pulmonary disease, unspecified: Secondary | ICD-10-CM | POA: Diagnosis not present

## 2015-10-27 ENCOUNTER — Other Ambulatory Visit: Payer: Self-pay | Admitting: Pulmonary Disease

## 2015-10-27 ENCOUNTER — Telehealth: Payer: Self-pay | Admitting: Pulmonary Disease

## 2015-10-27 DIAGNOSIS — J8410 Pulmonary fibrosis, unspecified: Secondary | ICD-10-CM

## 2015-10-27 MED ORDER — MYCOPHENOLATE MOFETIL 500 MG PO TABS: 500.0000 mg | ORAL_TABLET | Freq: Two times a day (BID) | ORAL | 3 refills | Status: DC

## 2015-10-27 NOTE — Telephone Encounter (Signed)
Called and spoke with pt and he is needing an rx sent in for the cellcept for a 90 day supply to the [facility]. This has been done and pt is aware.

## 2015-11-01 DIAGNOSIS — J8410 Pulmonary fibrosis, unspecified: Secondary | ICD-10-CM | POA: Diagnosis not present

## 2015-11-08 ENCOUNTER — Ambulatory Visit: Payer: Commercial Managed Care - HMO | Admitting: Pulmonary Disease

## 2015-11-08 ENCOUNTER — Ambulatory Visit: Payer: Commercial Managed Care - HMO

## 2015-11-13 ENCOUNTER — Other Ambulatory Visit: Payer: Self-pay | Admitting: Pulmonary Disease

## 2015-11-15 DIAGNOSIS — J8410 Pulmonary fibrosis, unspecified: Secondary | ICD-10-CM | POA: Diagnosis not present

## 2015-11-15 DIAGNOSIS — M25561 Pain in right knee: Secondary | ICD-10-CM | POA: Diagnosis not present

## 2015-11-15 DIAGNOSIS — J301 Allergic rhinitis due to pollen: Secondary | ICD-10-CM | POA: Diagnosis not present

## 2015-11-15 DIAGNOSIS — R739 Hyperglycemia, unspecified: Secondary | ICD-10-CM | POA: Diagnosis not present

## 2015-11-15 DIAGNOSIS — E785 Hyperlipidemia, unspecified: Secondary | ICD-10-CM | POA: Diagnosis not present

## 2015-11-15 DIAGNOSIS — Z9181 History of falling: Secondary | ICD-10-CM | POA: Diagnosis not present

## 2015-11-15 DIAGNOSIS — Z6829 Body mass index (BMI) 29.0-29.9, adult: Secondary | ICD-10-CM | POA: Diagnosis not present

## 2015-11-15 DIAGNOSIS — E559 Vitamin D deficiency, unspecified: Secondary | ICD-10-CM | POA: Diagnosis not present

## 2015-11-15 DIAGNOSIS — M069 Rheumatoid arthritis, unspecified: Secondary | ICD-10-CM | POA: Diagnosis not present

## 2015-11-15 DIAGNOSIS — E663 Overweight: Secondary | ICD-10-CM | POA: Diagnosis not present

## 2015-11-17 DIAGNOSIS — M069 Rheumatoid arthritis, unspecified: Secondary | ICD-10-CM | POA: Diagnosis not present

## 2015-11-17 DIAGNOSIS — J8410 Pulmonary fibrosis, unspecified: Secondary | ICD-10-CM | POA: Diagnosis not present

## 2015-11-17 DIAGNOSIS — J849 Interstitial pulmonary disease, unspecified: Secondary | ICD-10-CM | POA: Diagnosis not present

## 2015-11-17 DIAGNOSIS — J9611 Chronic respiratory failure with hypoxia: Secondary | ICD-10-CM | POA: Diagnosis not present

## 2015-11-20 ENCOUNTER — Ambulatory Visit (INDEPENDENT_AMBULATORY_CARE_PROVIDER_SITE_OTHER): Payer: Commercial Managed Care - HMO | Admitting: Pulmonary Disease

## 2015-11-20 ENCOUNTER — Encounter: Payer: Self-pay | Admitting: Pulmonary Disease

## 2015-11-20 VITALS — BP 110/66 | HR 102 | Ht 66.0 in | Wt 196.0 lb

## 2015-11-20 DIAGNOSIS — Z23 Encounter for immunization: Secondary | ICD-10-CM | POA: Diagnosis not present

## 2015-11-20 DIAGNOSIS — R0600 Dyspnea, unspecified: Secondary | ICD-10-CM

## 2015-11-20 DIAGNOSIS — J9611 Chronic respiratory failure with hypoxia: Secondary | ICD-10-CM | POA: Diagnosis not present

## 2015-11-20 DIAGNOSIS — J849 Interstitial pulmonary disease, unspecified: Secondary | ICD-10-CM | POA: Diagnosis not present

## 2015-11-20 DIAGNOSIS — K219 Gastro-esophageal reflux disease without esophagitis: Secondary | ICD-10-CM

## 2015-11-20 DIAGNOSIS — D899 Disorder involving the immune mechanism, unspecified: Secondary | ICD-10-CM

## 2015-11-20 DIAGNOSIS — D849 Immunodeficiency, unspecified: Secondary | ICD-10-CM

## 2015-11-20 LAB — PULMONARY FUNCTION TEST
DL/VA % PRED: 66 %
DL/VA: 2.88 ml/min/mmHg/L
DLCO COR % PRED: 37 %
DLCO COR: 10.13 ml/min/mmHg
DLCO UNC % PRED: 38 %
DLCO unc: 10.44 ml/min/mmHg
FEF 25-75 Pre: 2.4 L/sec
FEF2575-%PRED-PRE: 128 %
FEV1-%Pred-Pre: 82 %
FEV1-Pre: 2.13 L
FEV1FVC-%PRED-PRE: 114 %
FEV6-%PRED-PRE: 76 %
FEV6-PRE: 2.55 L
FEV6FVC-%Pred-Pre: 107 %
FVC-%PRED-PRE: 71 %
FVC-Pre: 2.55 L
PRE FEV6/FVC RATIO: 100 %
Pre FEV1/FVC ratio: 84 %

## 2015-11-20 NOTE — Progress Notes (Signed)
Test reviewed.  

## 2015-11-20 NOTE — Progress Notes (Signed)
Subjective:    Patient ID: Jeff Lynch, male    DOB: March 06, 1940, 75 y.o.   MRN: [id number]  C.C.:  Follow-up for ILD, Chronic Hypoxic Respiratory Failure, GERD, & Chronic Immunosuppression.  HPI ILD:  Patient's rheumatoid factor & anti-CCP were previously positive. Patient currently prescribed Cellcept and Prednisone 55m daily. He repots he is still having intermittent dyspnea. He reports intermittent coughing that he attributes to some post-nasal drainage.   Chronic Hypoxic Respiratory Failure:  Prescribed oxygen at 2 L/m with exertion previously. Walk test today again shows a 2 L/m requirement with exertion. He reports he is using his oxygen with exertion.   GERD:  Currently on Prilosec as needed. Denies any new reflux or dyspepsia. Symptoms vary with diet.   Chronic Immunosuppression:  Currently on Prednisone & Cellcept. On Bactrim for PCP prophylaxis.  Review of Systems He is having chronic knee pain that limits his exertion. He reports minimal sinus congestion or pressure. He reports rare chest pressure with hypoxia on exertion. No frank chest pain. No fever, chills, or sweats.   No Known Allergies  Current Outpatient Prescriptions on File Prior to Visit  Medication Sig Dispense Refill  . acetaminophen (TYLENOL) 325 MG tablet Take 325 mg by mouth every 6 (six) hours as needed. Reported on 05/10/2015    . allopurinol (ZYLOPRIM) 100 MG tablet Take 1 tablet by mouth daily.    .Marland Kitchenaspirin 81 MG tablet Take 81 mg by mouth daily.    .Marland KitchenCALCIUM PO Take by mouth 2 (two) times daily.    . Cyanocobalamin (VITAMIN B-12 PO) Take 1 tablet by mouth daily.    . mycophenolate (CELLCEPT) 500 MG tablet TAKE 1 TABLET TWICE DAILY 60 tablet 0  . omeprazole (PRILOSEC) 20 MG capsule Take 1 capsule (20 mg total) by mouth daily. (Patient taking differently: Take 20 mg by mouth daily as needed. As needed) 30 capsule 6  . predniSONE (DELTASONE) 10 MG tablet TAKE 1 TABLET EVERY DAY 90 tablet 0  .  sulfamethoxazole-trimethoprim (BACTRIM DS,SEPTRA DS) 800-160 MG tablet Take 1 tablet by mouth 3 (three) times a week. On Mon, Wed, Friday 12 tablet 3  . Vitamin D, Ergocalciferol, (DRISDOL) 50000 UNITS CAPS capsule Take 50,000 Units by mouth every 7 (seven) days. Reported on 08/07/2015     No current facility-administered medications on file prior to visit.     Past Medical History:  Diagnosis Date  . Acute pain of right shoulder   . Cervical pain   . Chronic respiratory failure with hypoxia ([place]   . Cough   . Esophagitis, reflux   . Gout   . Hyperlipemia   . ILD (interstitial lung disease) ([place]    Likely Secondary to RA  . Impotence, organic   . Knee joint pain   . Prostate cancer ([facility] [place]   . Rheumatoid arthritis ([facility]   . SOB (shortness of breath)   . Vitamin B12 deficiency   . Vitamin D deficiency     Past Surgical History:  Procedure Laterality Date  . BACK SURGERY  1990  . PROSTATECTOMY  2003    Family History  Problem Relation Age of Onset  . Liver disease Father   . Prostate cancer Brother   . Lung disease Neg Hx   . Rheumatologic disease Neg Hx     Social History   Social History  . Marital status: Single    Spouse name: N/A  . Number of children: N/A  . Years of education: N/A  Occupational History  . [employer]     Stocking  . [employer]     retired   Social History Main Topics  . Smoking status: Former Smoker    Packs/day: 1.00    Years: 40.00    Types: Cigarettes    Quit date: 01/02/2002  . Smokeless tobacco: Never Used  . Alcohol use No  . Drug use: No  . Sexual activity: Not Asked   Other Topics Concern  . None   Social History Narrative   Originally from [place]. Always lived in [place]. Prior travel to [place], [place], [place], [place], & [place]. No international travel. 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 No known asbestos exposure. Remote exposure to birds/parakeets briefly. Has a dog currently. No mold exposure.         Objective:   Physical Exam BP 110/66 (BP Location: Left Arm, Cuff Size: Normal)   Pulse (!) 102   Ht 5' 6" (1.676 m)   Wt 196 lb (88.9 kg)   SpO2 95%   BMI 31.64 kg/m  General:  Awake. Alert. No distress. Mild central obesity.  Integument:  Warm & dry. No rash on exposed skin. Bruising of exposed skin on upper extremities noted. HEENT:  Moist mucus membranes. No oral ulcers. No scleral icterus. Cardiovascular:  Regular rate. Trace lower extremity edema unchanged . 3/6 systolic murmur at aortic position. Pulmonary:  Basilar Velcro crackles unchanged. Good aeration bilaterally. Normal work of breathing at room air on rest. Abdomen: Soft. Normal bowel sounds. Nontender. Musculoskeletal:  Normal bulk and tone. No joint  effusion appreciated.   PFT 11/20/15: FVC 2.55 L (71%) FEV1 2.13 L (82%) FEV1/FVC 0.84 FEF 25-75 2.40 L (128%)                                                                                                           DLCO corrected 37% (Hgb 15.7) 07/17/15: FVC 2.65 L (73%) FEV1 2.18 L (84%) FEV1/FVC 0.83 FEF 25-75 2.42 L (128%) no bronchodilator response TLC 4.09 L (65%) RV 56% ERV 63% DLCO corrected 41% (Hgb 16.3)  6MWT 11/20/15:  96 meters / Baseline Sat 95% on RA / Nadir Sat 83% on RA (required 2 L/m to maintain saturation & took multiple breaks during the walk with pain) 07/17/15: 192 meters / Baseline Sat 95% on RA / Nadir Sat 82% on RA at End of Walk (required 2 L/m to maintain w/ exertion) 08/23/14:  Nadir saturation 86% on RA - Saturated 93% on 2 L/m continuous  IMAGING HRCT CHEST W/O 06/26/15 (previously reviewed by me): Borderline enlarged mediastinal lymph nodes measuring up to 1.2 cm in short axis. Slightly basilar predominant subpleural reticular changes with some traction bronchiectasis and minimal groundglass. No appreciable honeycombing changes. No other parenchymal nodule or opacity appreciated. Centrilobular emphysema is also present. No pleural effusion or  thickening. No pericardial effusion.  CT CHEST W/O 12/31/13 (per radiologist):Numerous scattered lymph nodes in mediastinum including 1.2 cm short axis AP window lymph node. Coarse peripheral interstitial lung disease likely with  early honeycombing. Underlying centrilobular emphysema. Nodule in left midlung. No other separate nodule identified. Thoracic spondylosis.  CARDIAC TTE (06/26/15): Mild LVH. EF 60-65%. LA mildly dilated. Mild to moderate aortic stenosis with mild regurgitation. Trivial mitral regurgitation. Left atrium mildly dilated.  LABS 11/15/15 CBC: 11.3/15.0/44.8/231 BMP: 142/4.7/100/26/19/1.07/111/10.5 LFT: 4.0/7.0/0.6/65/17/13  05/15/15 CBC: 10.6/15.7/46.5/250 BMP: 139/4.8/98/28/14/1.02/117/10.3 LFT: 4.1/7.4/0.5/69/15/13  11/16/14 BMP: 140/4.5/101/23/11/0.79/119/9.2 LFT: 3.9/6.4/0.5/56/16/13 CBC: 9.0/14.8/45.2/199  01/26/14 CK: 104 ESR: 87 ANA: Negative Anti-CCP: 231.3 RF: 503    Assessment & Plan:  75 y.o. male with underlying interstitial lung disease presumably related to rheumatoid given serologies.  patient's spirometry is mildly worse today and his walk test distance is somewhat decreased as well. His knee pain which precipitates back pain seems to be a significant limiting factor for the patient. He continues to demonstrate a stable oxygen requirement therefore do not feel that adjusting his immunosuppressive regimen is necessary at this time. He is continuing on PCP prophylaxis with Bactrim and normal lab work as evidenced recently. His reflux seems to be reasonably well controlled at this time as well. I did urge the patient to notify me if he had any new breathing problems or questions before his next appointment. If his spirometry continues to decline I may need to increase his immunosuppression with CellCept.   1. ILD:  Likely due to underlying rheumatoid. Continuing CellCept & prednisone without change. Repeat spirometry with DLCO at next  appointment. 2. Chronic Hypoxic Respiratory Failure: Continuing oxygen at 2 L/m as prescribed. 3. Chronic Immunosuppression:  Continuing Bactrim DS on Monday, Wednesday, & Friday for PCP prophylaxis.  4. GERD: Currently asymptomatic. Continue to use Prilosec when necessary.  5. Health Maintenance: Pneumovax September 2015 & Prevnar March 2017. Administering high-dose influenza vaccine today. 6. Follow-up: Patient to return to clinic in  3 months or sooner if needed.  Sonia Baller Ashok Cordia, M.D. [facility] Pager:  [phone] After 3pm or if no response, call [phone] 12:01 PM 11/20/15

## 2015-11-20 NOTE — Patient Instructions (Signed)
   Continue taking your medications as prescribed.  Call me if you have any new breathing problems before your next appointment.  I will see you back in 3 months or sooner if needed.  TESTS ORDERED: 1. Spirometry with DLCO at next appointment

## 2015-11-27 DIAGNOSIS — M170 Bilateral primary osteoarthritis of knee: Secondary | ICD-10-CM | POA: Diagnosis not present

## 2015-12-02 DIAGNOSIS — J8410 Pulmonary fibrosis, unspecified: Secondary | ICD-10-CM | POA: Diagnosis not present

## 2015-12-07 ENCOUNTER — Encounter: Payer: Self-pay | Admitting: Pulmonary Disease

## 2015-12-17 DIAGNOSIS — M069 Rheumatoid arthritis, unspecified: Secondary | ICD-10-CM | POA: Diagnosis not present

## 2015-12-17 DIAGNOSIS — J8410 Pulmonary fibrosis, unspecified: Secondary | ICD-10-CM | POA: Diagnosis not present

## 2015-12-17 DIAGNOSIS — J849 Interstitial pulmonary disease, unspecified: Secondary | ICD-10-CM | POA: Diagnosis not present

## 2015-12-17 DIAGNOSIS — J9611 Chronic respiratory failure with hypoxia: Secondary | ICD-10-CM | POA: Diagnosis not present

## 2016-01-01 DIAGNOSIS — J8410 Pulmonary fibrosis, unspecified: Secondary | ICD-10-CM | POA: Diagnosis not present

## 2016-01-17 DIAGNOSIS — M069 Rheumatoid arthritis, unspecified: Secondary | ICD-10-CM | POA: Diagnosis not present

## 2016-01-17 DIAGNOSIS — J849 Interstitial pulmonary disease, unspecified: Secondary | ICD-10-CM | POA: Diagnosis not present

## 2016-01-17 DIAGNOSIS — J9611 Chronic respiratory failure with hypoxia: Secondary | ICD-10-CM | POA: Diagnosis not present

## 2016-01-17 DIAGNOSIS — J8410 Pulmonary fibrosis, unspecified: Secondary | ICD-10-CM | POA: Diagnosis not present

## 2016-02-01 DIAGNOSIS — J8410 Pulmonary fibrosis, unspecified: Secondary | ICD-10-CM | POA: Diagnosis not present

## 2016-02-16 DIAGNOSIS — E538 Deficiency of other specified B group vitamins: Secondary | ICD-10-CM | POA: Diagnosis not present

## 2016-02-16 DIAGNOSIS — J9611 Chronic respiratory failure with hypoxia: Secondary | ICD-10-CM | POA: Diagnosis not present

## 2016-02-16 DIAGNOSIS — Z6829 Body mass index (BMI) 29.0-29.9, adult: Secondary | ICD-10-CM | POA: Diagnosis not present

## 2016-02-16 DIAGNOSIS — J849 Interstitial pulmonary disease, unspecified: Secondary | ICD-10-CM | POA: Diagnosis not present

## 2016-02-16 DIAGNOSIS — M109 Gout, unspecified: Secondary | ICD-10-CM | POA: Diagnosis not present

## 2016-02-16 DIAGNOSIS — Z1389 Encounter for screening for other disorder: Secondary | ICD-10-CM | POA: Diagnosis not present

## 2016-02-16 DIAGNOSIS — R7309 Other abnormal glucose: Secondary | ICD-10-CM | POA: Diagnosis not present

## 2016-02-16 DIAGNOSIS — E559 Vitamin D deficiency, unspecified: Secondary | ICD-10-CM | POA: Diagnosis not present

## 2016-02-16 DIAGNOSIS — M069 Rheumatoid arthritis, unspecified: Secondary | ICD-10-CM | POA: Diagnosis not present

## 2016-02-16 DIAGNOSIS — E785 Hyperlipidemia, unspecified: Secondary | ICD-10-CM | POA: Diagnosis not present

## 2016-02-16 DIAGNOSIS — K210 Gastro-esophageal reflux disease with esophagitis: Secondary | ICD-10-CM | POA: Diagnosis not present

## 2016-02-16 DIAGNOSIS — E663 Overweight: Secondary | ICD-10-CM | POA: Diagnosis not present

## 2016-02-16 DIAGNOSIS — J8410 Pulmonary fibrosis, unspecified: Secondary | ICD-10-CM | POA: Diagnosis not present

## 2016-02-20 ENCOUNTER — Ambulatory Visit (INDEPENDENT_AMBULATORY_CARE_PROVIDER_SITE_OTHER): Payer: Commercial Managed Care - HMO | Admitting: Pulmonary Disease

## 2016-02-20 ENCOUNTER — Encounter: Payer: Self-pay | Admitting: Pulmonary Disease

## 2016-02-20 VITALS — BP 114/66 | HR 63 | Ht 66.0 in | Wt 195.0 lb

## 2016-02-20 DIAGNOSIS — J849 Interstitial pulmonary disease, unspecified: Secondary | ICD-10-CM

## 2016-02-20 DIAGNOSIS — J9611 Chronic respiratory failure with hypoxia: Secondary | ICD-10-CM

## 2016-02-20 DIAGNOSIS — K219 Gastro-esophageal reflux disease without esophagitis: Secondary | ICD-10-CM | POA: Diagnosis not present

## 2016-02-20 DIAGNOSIS — D899 Disorder involving the immune mechanism, unspecified: Secondary | ICD-10-CM | POA: Diagnosis not present

## 2016-02-20 DIAGNOSIS — D849 Immunodeficiency, unspecified: Secondary | ICD-10-CM

## 2016-02-20 LAB — PULMONARY FUNCTION TEST
DL/VA % pred: 73 %
DL/VA: 3.18 ml/min/mmHg/L
DLCO COR: 11.72 ml/min/mmHg
DLCO UNC % PRED: 43 %
DLCO UNC: 11.62 ml/min/mmHg
DLCO cor % pred: 43 %
FEF 25-75 PRE: 2.36 L/s
FEF2575-%PRED-PRE: 126 %
FEV1-%Pred-Pre: 83 %
FEV1-Pre: 2.14 L
FEV1FVC-%PRED-PRE: 114 %
FEV6-%PRED-PRE: 77 %
FEV6-PRE: 2.57 L
FEV6FVC-%Pred-Pre: 107 %
FVC-%PRED-PRE: 71 %
FVC-Pre: 2.57 L
PRE FEV1/FVC RATIO: 83 %
PRE FEV6/FVC RATIO: 100 %

## 2016-02-20 NOTE — Progress Notes (Signed)
Test reviewed.  

## 2016-02-20 NOTE — Patient Instructions (Addendum)
   Continue your medications as prescribed.   Call me if you have any new breathing problems before your next appointment.  I will see you back in 3 months or sooner if needed.   TESTS ORDERED: 1. Spirometry with DLCO at next appointment 2. 6MWT with oxygen titration at next appointment

## 2016-02-20 NOTE — Progress Notes (Signed)
Subjective:    Patient ID: Jeff Lynch, male    DOB: 01/10/1941, 75 y.o.   MRN: [id number]  C.C.:  Follow-up for ILD, Chronic Hypoxic Respiratory Failure, GERD, & Immunosuppressed Status.  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 Prescribed CellCept 500 mg by mouth twice a day & Prednisone 10 mg by mouth daily. He does feel his dyspnea has worsened slightly since his last appointment. Previously was not using his oxygen consistently with exertion. Reports an intermittent cough producing a "gray" mucus.  Chronic Hypoxic Respiratory Failure:  Prescribed oxygen at 2 L/m with exertion previously. Confirmed at last appointment with repeat walk test. Previously hadn't been consistently using his oxygen with exertion but is now using it. Has a portable concentrator.  GERD:  Previously using Prilosec on an as-needed basis. He reports he has infrequent reflux or dyspepsia. Denies any morning brash water taste.  Immunosuppressed Status:  Prescribed Prednisone & Cellcept. On Bactrim for PCP prophylaxis.  Review of Systems  He reports he has had some increased sinus congestion & drainage. He reports he has had some mild wheezing. Denies any fever, chills, or sweats. He denies any sinus pressure or pain. Does report some mild chest discomfort with exertion that resolves with rest. Denies any new chest pain or pressure. He reports he is continuing to have pain in his legs/knees.   No Known Allergies  Current Outpatient Prescriptions on File Prior to Visit  Medication Sig Dispense Refill  . acetaminophen (TYLENOL) 325 MG tablet Take 325 mg by mouth every 6 (six) hours as needed. Reported on 05/10/2015    . allopurinol (ZYLOPRIM) 100 MG tablet Take 1 tablet by mouth daily.    [name] aspirin 81 MG tablet Take 81 mg by mouth daily.    [name] atorvastatin (LIPITOR) 80 MG tablet Take 80 mg by mouth daily.    [name] CALCIUM PO Take by mouth 2 (two) times daily.    . Cyanocobalamin (VITAMIN B-12  PO) Take 1 tablet by mouth daily.    . mycophenolate (CELLCEPT) 500 MG tablet TAKE 1 TABLET TWICE DAILY 60 tablet 0  . omeprazole (PRILOSEC) 20 MG capsule Take 1 capsule (20 mg total) by mouth daily. (Patient taking differently: Take 20 mg by mouth daily as needed. As needed) 30 capsule 6  . predniSONE (DELTASONE) 10 MG tablet TAKE 1 TABLET EVERY DAY 90 tablet 0  . sulfamethoxazole-trimethoprim (BACTRIM DS,SEPTRA DS) 800-160 MG tablet Take 1 tablet by mouth 3 (three) times a week. On Mon, Wed, Friday 12 tablet 3  . Vitamin D, Ergocalciferol, (DRISDOL) 50000 UNITS CAPS capsule Take 50,000 Units by mouth every 7 (seven) days. Reported on 08/07/2015     No current facility-administered medications on file prior to visit.     Past Medical History:  Diagnosis Date  . Acute pain of right shoulder   . Cervical pain   . Chronic respiratory failure with hypoxia ([place])   . Cough   . Esophagitis, reflux   . Gout   . Hyperlipemia   . ILD (interstitial lung disease) ([place])    Likely Secondary to RA  . Impotence, organic   . Knee joint pain   . Prostate cancer ([place])   . Rheumatoid arthritis ([place])   . SOB (shortness of breath)   . Vitamin B12 deficiency   . Vitamin D deficiency     Past Surgical History:  Procedure Laterality Date  . BACK SURGERY  1990  . PROSTATECTOMY  2003  Family History  Problem Relation Age of Onset  . Liver disease Father   . Prostate cancer Brother   . Lung disease Neg Hx   . Rheumatologic disease Neg Hx     Social History   Social History  . Marital status: Single    Spouse name: N/A  . Number of children: N/A  . Years of education: N/A   Occupational History  . [employer]     Stocking  . [employer]     retired   Social History Main Topics  . Smoking status: Former Smoker    Packs/day: 1.00    Years: 40.00    Types: Cigarettes    Quit date: 01/02/2002  . Smokeless tobacco: Never Used  . Alcohol use No  . Drug use: No  . Sexual activity:  Not Asked   Other Topics Concern  . None   Social History Narrative   Originally from [place]. Always lived in [place]. Prior travel to [place], [place], [place], [place], & [place]. No international travel. 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 No known asbestos exposure. Remote exposure to birds/parakeets briefly. Has a dog currently. No mold exposure.       Objective:   Physical Exam BP 114/66 (BP Location: Left Arm, Cuff Size: Normal)   Pulse 63   Ht '5\' 6"'  (1.676 m)   Wt 195 lb (88.5 kg)   SpO2 96%   BMI 31.47 kg/m  General:  Awake. No distress. Mild central obesity. Comfortable. Integument:  Warm & dry. No rash on exposed skin. Bruising of exposed skin on hands noted. HEENT:  Moist mucus membranes. No oral ulcers. Mild bilateral nasal turbinate swelling with erythematous mucosa. Cardiovascular:  Regular rate. No edema. 3/6 systolic murmur at aortic position unchanged. Pulmonary:  Basilar Velcro crackles persist. Good aeration bilaterally. Normal work of breathing on supplemental oxygen. Abdomen: Soft. Normal bowel sounds. Nontender. Protuberant. Musculoskeletal:  Normal bulk and tone. No joint  effusion appreciated.   PFT 02/20/16: FVC 2.57 L (71%) FEV1 2.14 L (83%) FEV1/FVC 0.83 FEF 25-75 2.36 L (126%)                                                                                                           DLCO corrected 43% (Hgb 14.3) 11/20/15: FVC 2.55 L (71%) FEV1 2.13 L (82%) FEV1/FVC 0.84 FEF 25-75 2.40 L (128%)                                                                                                           DLCO corrected 37% (Hgb 15.7) 07/17/15: FVC  2.65 L (73%) FEV1 2.18 L (84%) FEV1/FVC 0.83 FEF 25-75 2.42 L (128%) no bronchodilator response TLC 4.09 L (65%) RV 56% ERV 63% DLCO corrected 41% (Hgb 16.3)  6MWT 11/20/15:  96 meters / Baseline Sat 95% on RA / Nadir Sat 83% on RA (required 2 L/m to maintain saturation & took multiple breaks during the walk with  pain) 07/17/15: 192 meters / Baseline Sat 95% on RA / Nadir Sat 82% on RA at End of Walk (required 2 L/m to maintain w/ exertion) 08/23/14:  Nadir saturation 86% on RA - Saturated 93% on 2 L/m continuous  IMAGING HRCT CHEST W/O 06/26/15 (previously reviewed by me): Borderline enlarged mediastinal lymph nodes measuring up to 1.2 cm in short axis. Slightly basilar predominant subpleural reticular changes with some traction bronchiectasis and minimal groundglass. No appreciable honeycombing changes. No other parenchymal nodule or opacity appreciated. Centrilobular emphysema is also present. No pleural effusion or thickening. No pericardial effusion.  CT CHEST W/O 12/31/13 (per radiologist):Numerous scattered lymph nodes in mediastinum including 1.2 cm short axis AP window lymph node. Coarse peripheral interstitial lung disease likely with early honeycombing. Underlying centrilobular emphysema. Nodule in left midlung. No other separate nodule identified. Thoracic spondylosis.  CARDIAC TTE (06/26/15): Mild LVH. EF 60-65%. LA mildly dilated. Mild to moderate aortic stenosis with mild regurgitation. Trivial mitral regurgitation. Left atrium mildly dilated.  LABS 11/15/15 CBC: 11.3/15.0/44.8/231 BMP: 142/4.7/100/26/19/1.07/111/10.5 LFT: 4.0/7.0/0.6/65/17/13  05/15/15 CBC: 10.6/15.7/46.5/250 BMP: 139/4.8/98/28/14/1.02/117/10.3 LFT: 4.1/7.4/0.5/69/15/13  11/16/14 BMP: 140/4.5/101/23/11/0.79/119/9.2 LFT: 3.9/6.4/0.5/56/16/13 CBC: 9.0/14.8/45.2/199  01/26/14 CK: 104 ESR: 87 ANA: Negative Anti-CCP: 231.3 RF: 503    Assessment & Plan:  75 y.o. male with underlying interstitial lung disease presumably related to rheumatoid given serologies. Patient's spirometry has remained stable therefore I do not feel that his dyspnea on exertion is secondary to worsening underlying interstitial lung disease. He is currently on chronic immunosuppression with mycophenolate as well as prednisone. Given his  stability and spirometry I am continuing the patient on his current dose of immunosuppressive medications. He reportedly had serum blood work performed on Friday which is not yet available for my review. His reflux remains minimal and relatively infrequent. I encouraged the patient to continue to use oxygen as prescribed with exertion. I instructed the patient to contact my office if he had any new breathing problems or questions before his next appointment.  1. ILD:  Continuing on CellCept 500 mg by mouth twice a day & prednisone 10 mg by mouth daily. Repeat spirometry with DLCO at next appointment. 2. Chronic Hypoxic Respiratory Failure: Prescribed oxygen at 2 L/m as prescribed. Repeat 6 walk test with oxygen titration at next appointment. If patient feels pain in his nieces to significant he will call to cancel this test. 3. Immunosuppressed Status:  Continuing Bactrim DS on Monday, Wednesday, & Friday for PCP prophylaxis.  4. GERD: Minimal symptoms. Continuing on Prilosec as needed. 5. Health Maintenance: S/P Influenza Vaccine September 2017, Pneumovax September 2015 & Prevnar March 2017.  6. Follow-up: Patient to return to clinic in  3 months or sooner if needed.  Sonia Baller Ashok Cordia, M.D. [facility] Pager:  [phone] After 3pm or if no response, call [phone] 1:52 PM 02/20/16

## 2016-03-02 DIAGNOSIS — J8410 Pulmonary fibrosis, unspecified: Secondary | ICD-10-CM | POA: Diagnosis not present

## 2016-03-11 ENCOUNTER — Telehealth: Payer: Self-pay | Admitting: Pulmonary Disease

## 2016-03-11 MED ORDER — LEVOFLOXACIN 750 MG PO TABS: 750.0000 mg | ORAL_TABLET | Freq: Every day | ORAL | 0 refills | Status: DC

## 2016-03-11 NOTE — Telephone Encounter (Signed)
Spoke with pt. He is aware of PM's recommendations. Pt became angry when I told him he needed a CXR. States he will get to when he gets to it. Rx has been sent in. Nothing further was needed.

## 2016-03-11 NOTE — Telephone Encounter (Signed)
Pt c/o worsening pnd, prod cough with clear/gray mucus, L sided chest discomfort when coughing or when taking deep breath, fatigue, weakness, chills.  S/s started X3 weeks ago.   Pt denies fever, nausea.  Has been taking tylenol and maintenance inhaled meds.  Requesting further recs.  Pt uses [facility] in [place].    Sending to DOD as Durene Cal is 11pm elink.  PM please advise.  Thanks!

## 2016-03-11 NOTE — Telephone Encounter (Signed)
Please ask him to get a CXR. Start levaquin 750 mg daily for 7 days.

## 2016-03-12 ENCOUNTER — Telehealth: Payer: Self-pay | Admitting: Pulmonary Disease

## 2016-03-12 DIAGNOSIS — J849 Interstitial pulmonary disease, unspecified: Secondary | ICD-10-CM

## 2016-03-12 NOTE — Telephone Encounter (Signed)
Spoke with pt, has since decided that he wants to get cxr at our facility.  Order placed.  Nothing further needed.

## 2016-03-13 ENCOUNTER — Other Ambulatory Visit: Payer: Self-pay | Admitting: Pulmonary Disease

## 2016-03-22 ENCOUNTER — Telehealth: Payer: Self-pay | Admitting: Pulmonary Disease

## 2016-03-22 ENCOUNTER — Ambulatory Visit (INDEPENDENT_AMBULATORY_CARE_PROVIDER_SITE_OTHER)
Admission: RE | Admit: 2016-03-22 | Discharge: 2016-03-22 | Disposition: A | Discharge status: Home / Self Care | Payer: Commercial Managed Care - HMO | Source: Ambulatory Visit | Attending: Pulmonary Disease | Admitting: Pulmonary Disease

## 2016-03-22 DIAGNOSIS — J849 Interstitial pulmonary disease, unspecified: Secondary | ICD-10-CM

## 2016-03-22 DIAGNOSIS — J8410 Pulmonary fibrosis, unspecified: Secondary | ICD-10-CM

## 2016-03-22 NOTE — Telephone Encounter (Signed)
Spoke with pt, who states he has discussed his need for a scooter with JN previously, due to his knees and sob. I have advised pt that I will get this message over to [facility], however his PCP may need to address this. Pt states he has spoke with Mcarthur Rossetti, who states they will need a Rx sent in.   JN please advise. Thanks.

## 2016-03-26 NOTE — Telephone Encounter (Signed)
Pt is requesting a update for an order for a scooter. JN please advise. Thanks.

## 2016-03-26 NOTE — Telephone Encounter (Signed)
[phone] calling about a scooter

## 2016-03-26 NOTE — Telephone Encounter (Signed)
I'm fine with prescribing him a scooter. Never prescribed one before so I have no idea what information [employer] may need. Just let me know. JN

## 2016-03-26 NOTE — Telephone Encounter (Signed)
Spoke with the pt  I advised Jeff Lynch order for scooter  Order sent to [facility]

## 2016-03-27 NOTE — Progress Notes (Signed)
Spoke with patient who was informed about xray results. Pt had no additional questions. Nothing further needed.

## 2016-03-28 ENCOUNTER — Telehealth: Payer: Self-pay | Admitting: Pulmonary Disease

## 2016-03-28 NOTE — Telephone Encounter (Signed)
Spoke with pt. States that he is having issues with getting his motorized scooter. He was contacted by [facility] and was told that his insurance will not cover this. The pt asked me to contact [facility] on his behalf about this because [employer] told him that they would cover. Contacted [employer] and the motorized scooter is needing a PA. [facility] is required to do this as they are supplying the pt with the equipment, not our office. I have left a message with Corene Cornea at [facility] to discuss this.

## 2016-04-02 ENCOUNTER — Telehealth: Payer: Self-pay | Admitting: Pulmonary Disease

## 2016-04-02 NOTE — Telephone Encounter (Signed)
Called [facility], spoke with Corene Cornea, states that they have no way of doing an PA on motorized scooters as they are retail items. Patient will need to contact his insurance to find out if they know where he can get this provided to him that is in network. Pt aware that he needs to contact [employer] to see if there is another company that he can go with other than [facility] that will be able to provide a motorized scooter. Nothing further needed. Will send to [facility] as FYI that we are having issues getting the patient his scooter but he is trying to get further information from his insurance about this.

## 2016-04-02 NOTE — Telephone Encounter (Signed)
Spoke with pt. And according to him, he states he spoke with [place] and they stated that what he will need to do is go look at scooter and find out which one he likes and then the information will be sent to [facility] and then an order can be placed and then see if insurance will cover it. Nothing further is needed at this time.

## 2016-04-22 ENCOUNTER — Telehealth: Payer: Self-pay | Admitting: Pulmonary Disease

## 2016-04-22 DIAGNOSIS — J9611 Chronic respiratory failure with hypoxia: Secondary | ICD-10-CM

## 2016-04-22 DIAGNOSIS — M069 Rheumatoid arthritis, unspecified: Secondary | ICD-10-CM

## 2016-04-22 NOTE — Telephone Encounter (Signed)
Spoke with pt and he gave the type of scooter he wants which is a 4 wheel [place] . Contacted pt insurance on his behalf and found out which DME is within his network. Was given [facility]. Order placed and Rodena Piety has the rx and will fax to DME. Nothing further is needed at this time.

## 2016-05-08 ENCOUNTER — Other Ambulatory Visit: Payer: Self-pay | Admitting: Pulmonary Disease

## 2016-05-08 ENCOUNTER — Telehealth: Payer: Self-pay | Admitting: Pulmonary Disease

## 2016-05-08 DIAGNOSIS — J9611 Chronic respiratory failure with hypoxia: Secondary | ICD-10-CM

## 2016-05-08 DIAGNOSIS — M069 Rheumatoid arthritis, unspecified: Secondary | ICD-10-CM

## 2016-05-08 NOTE — Telephone Encounter (Signed)
I called [facility] at the # provided & customer service rep stated they do not provide scooters - the provide urology supplies only.

## 2016-05-08 NOTE — Telephone Encounter (Signed)
Pt aware that an order was placed to the [facility] preferred company that can supply the scooter for him. Pt aware that he will be receiving a call about this once order is received. Nothing further needed.  Tyson Dense, RN  05/08/16 2:48 PM  Note    Received fax from [place] stating they can not fill order for electric scooter as they do not care them. I contacted [employer] at [phone] who stated the patient could get the scooter through [place], [phone]. Another order was placed for electric scooter using [facility]. Pt was called to inform him of update; he did not answer. A voicemail was left for him to contact the office. Will follow up with patient.

## 2016-05-08 NOTE — Telephone Encounter (Signed)
Received fax from [place] stating they can not fill order for electric scooter as they do not care them. I contacted [employer] at [phone] who stated the patient could get the scooter through [place], [phone]. Another order was placed for electric scooter using [facility]. Pt was called to inform him of update; he did not answer. A voicemail was left for him to contact the office. Will follow up with patient.

## 2016-05-09 NOTE — Telephone Encounter (Signed)
ATC, line busy  wcb

## 2016-05-09 NOTE — Telephone Encounter (Signed)
Pt calling a/b scooter and says he meant [facility] for the scooter.Hillery Hunter

## 2016-05-10 NOTE — Telephone Encounter (Signed)
[employer] was not called by me at 3:11 on 05/10/16.

## 2016-05-10 NOTE — Telephone Encounter (Signed)
I have resent that order to [facility] if they need anything else they will let us know Joellen Jersey

## 2016-05-10 NOTE — Telephone Encounter (Signed)
Spoke with pt, verified that he needs this order through [facility]- pt viewed a scooter at [employer] location and was told that this was in stock and available for him. There is an order from 03/26/16 in pt chart for a motorized scooter to [facility]. PCC's please advise if this order can be used.  Thanks.

## 2016-05-21 ENCOUNTER — Telehealth: Payer: Self-pay | Admitting: Pulmonary Disease

## 2016-05-21 NOTE — Telephone Encounter (Signed)
lmtcb X1 for Jeff Lynch at [facility]

## 2016-05-22 ENCOUNTER — Encounter: Payer: Self-pay | Admitting: Pulmonary Disease

## 2016-05-22 ENCOUNTER — Ambulatory Visit (INDEPENDENT_AMBULATORY_CARE_PROVIDER_SITE_OTHER): Payer: Medicare PPO | Admitting: Pulmonary Disease

## 2016-05-22 ENCOUNTER — Ambulatory Visit: Payer: Commercial Managed Care - HMO | Admitting: Pulmonary Disease

## 2016-05-22 VITALS — BP 140/88 | HR 93 | Ht 66.0 in | Wt 197.0 lb

## 2016-05-22 DIAGNOSIS — J9611 Chronic respiratory failure with hypoxia: Secondary | ICD-10-CM | POA: Diagnosis not present

## 2016-05-22 DIAGNOSIS — K219 Gastro-esophageal reflux disease without esophagitis: Secondary | ICD-10-CM

## 2016-05-22 DIAGNOSIS — J849 Interstitial pulmonary disease, unspecified: Secondary | ICD-10-CM | POA: Diagnosis not present

## 2016-05-22 DIAGNOSIS — R0602 Shortness of breath: Secondary | ICD-10-CM

## 2016-05-22 DIAGNOSIS — D899 Disorder involving the immune mechanism, unspecified: Secondary | ICD-10-CM | POA: Diagnosis not present

## 2016-05-22 DIAGNOSIS — D849 Immunodeficiency, unspecified: Secondary | ICD-10-CM

## 2016-05-22 LAB — PULMONARY FUNCTION TEST
DL/VA % pred: 68 %
DL/VA: 2.97 ml/min/mmHg/L
DLCO COR: 9.62 ml/min/mmHg
DLCO UNC: 10.17 ml/min/mmHg
DLCO cor % pred: 35 %
DLCO unc % pred: 37 %
FEF 25-75 PRE: 1.82 L/s
FEF2575-%PRED-PRE: 98 %
FEV1-%Pred-Pre: 72 %
FEV1-Pre: 1.86 L
FEV1FVC-%Pred-Pre: 110 %
FEV6-%PRED-PRE: 67 %
FEV6-Pre: 2.25 L
FEV6FVC-%PRED-PRE: 104 %
FVC-%Pred-Pre: 64 %
FVC-PRE: 2.31 L
PRE FEV1/FVC RATIO: 81 %
PRE FEV6/FVC RATIO: 98 %

## 2016-05-22 MED ORDER — PREDNISONE 10 MG PO TABS: 40.0000 mg | ORAL_TABLET | Freq: Every day | ORAL | 1 refills | Status: DC

## 2016-05-22 NOTE — Telephone Encounter (Signed)
Called spoke with patient who okayed for this office to send information to [facility] regarding his request for a motorized wheelchair.    Called [facility] and spoke with Arby Barrette - she is requesting the demographic information because they have little to no information on patient in their system.  She is also needing the ov note or any documentation in pt's chart stating the need for a motorized wheelchair.  The referral was not generated from a visit, but rather the patient called in to the office to request this referral - pt requested the motorized wheelchair for his knees and shortness of breath.  Pt was seen today in the office, but JN was not aware of the need to do a face-to-face for a motorized wheelchair.  However, in JN's note he mentions that pt is seeing his PCP tomorrow.  Anders Grant of this and that I will call pt's PCP to verify appt tomorrow and to see if his PCP can include the face-to-face in with the visit tomorrow as this is not typically a referral that our office makes.  Anders Grant will still send what information we have regarding the referral and pt's PCP name/phone number.  Paige voiced her understanding.  Called pt's PCP Dr. Josue Hector office and spoke with receptionist Renee.  She verified that pt does indeed have an appt with Dr Micheal Likens tomorrow at 0800 and will send message to Dr Micheal Likens requesting the motorized wheelchair be addressed (Dr Viviann Spare nurse was unavailable to talk to).  Snapshot, ov note from today and the phone notes addressing pt's request for the motorized wheelchair faxed to Paige's verified fax number.  Nothing further needed at this time; will sign off.

## 2016-05-22 NOTE — Telephone Encounter (Signed)
Paige from [facility] called back looking for demographics and last ov note - she can be reached at [phone] - fax number [phone] -pr

## 2016-05-22 NOTE — Progress Notes (Signed)
Test reviewed.  

## 2016-05-22 NOTE — Telephone Encounter (Signed)
JN please advise if you are ok with [place] sending the Last ov note and demographics to [facility] for the wheelchair that the pt is needing.  thanks

## 2016-05-22 NOTE — Progress Notes (Signed)
PFT done today. 

## 2016-05-22 NOTE — Patient Instructions (Signed)
   Continue taking all your medications as prescribed but hold off on your usual dose of Prednisone. I'm sending in a new Prednisone prescription.  You will start a new Prednisone prescription with '10mg'$  tablets - you will take 4 tablets ('40mg'$ ) daily until I see you back.  Call me if you have any new breathing problems or questions.  We are also sending in a new prescription to your oxygen company as you need 2 L/m at rest and 6 L/m when you are exerting yourself and it cannot be pulse flow from your portable concentrator.  TESTS ORDERED: 1. Spirometry with DLCO at next appointment 2. 6MWT with oxygen titration at next appointment

## 2016-05-22 NOTE — Progress Notes (Signed)
Subjective:    Patient ID: Jeff Lynch, male    DOB: 1940/05/30, 76 y.o.   MRN: [id number]  C.C.:  Follow-up for ILD, Chronic Hypoxic Respiratory Failure, Immunosuppressed Status, & GERD.  HPI ILD: Currently prescribed CellCept 500 mg by mouth twice a day & prednisone 10 mg by mouth daily. Labwork monitored by his PCP. He reports his dyspnea is largely unchanged. He reports he does have an intermittent cough that he attributes to his post-nasal drainage. He previously had a cough in January that significantly improved after a course of Levaquin.   Chronic hypoxic respiratory failure: Prescribed oxygen at 2 L/m with exertion. Patient is using a portable oxygen concentrator. The patient demonstrated a required of 2 L/m pulse at rest and 6 L/m continuous flow to maintain his saturation with exertion.   Immunosuppressed status: Currently on prednisone and CellCept. Prescribed Bactrim DS on Monday, Wednesday, and Friday for PCP prophylaxis. Patient was treated with Levaquin for 7 days for productive cough in January.  GERD: Previously using Prilosec as needed. He reports no recent reflux or dyspepsia.   Review of Systems  He does reports some chest pain with exertion when his oxygen gets low. Otherwise he has no pain or pressure in his chest. He denies any fever, chills, or sweats. Reports he previously had a day of diarrhea and nausea but this has resolved.   No Known Allergies  Current Outpatient Prescriptions on File Prior to Visit  Medication Sig Dispense Refill  . acetaminophen (TYLENOL) 325 MG tablet Take 325 mg by mouth every 6 (six) hours as needed. Reported on 05/10/2015    . allopurinol (ZYLOPRIM) 100 MG tablet Take 1 tablet by mouth daily.    [name] aspirin 81 MG tablet Take 81 mg by mouth daily.    [name] atorvastatin (LIPITOR) 80 MG tablet Take 80 mg by mouth daily.    [name] CALCIUM PO Take by mouth 2 (two) times daily.    . Cyanocobalamin (VITAMIN B-12 PO) Take 1 tablet by mouth daily.    .  mycophenolate (CELLCEPT) 500 MG tablet TAKE 1 TABLET TWICE DAILY 60 tablet 0  . omeprazole (PRILOSEC) 20 MG capsule Take 1 capsule (20 mg total) by mouth daily. (Patient taking differently: Take 20 mg by mouth daily as needed. As needed) 30 capsule 6  . predniSONE (DELTASONE) 10 MG tablet TAKE 1 TABLET EVERY DAY 90 tablet 0  . sulfamethoxazole-trimethoprim (BACTRIM DS,SEPTRA DS) 800-160 MG tablet Take 1 tablet by mouth 3 (three) times a week. On Mon, Wed, Friday 12 tablet 3  . Vitamin D, Ergocalciferol, (DRISDOL) 50000 UNITS CAPS capsule Take 50,000 Units by mouth every 7 (seven) days. Reported on 08/07/2015    . levofloxacin (LEVAQUIN) 750 MG tablet Take 1 tablet (750 mg total) by mouth daily. (Patient not taking: Reported on 05/22/2016) 7 tablet 0   No current facility-administered medications on file prior to visit.     Past Medical History:  Diagnosis Date  . Acute pain of right shoulder   . Cervical pain   . Chronic respiratory failure with hypoxia ([place])   . Cough   . Esophagitis, reflux   . Gout   . Hyperlipemia   . ILD (interstitial lung disease) ([place])    Likely Secondary to RA  . Impotence, organic   . Knee joint pain   . Prostate cancer ([place])   . Rheumatoid arthritis ([place])   . SOB (shortness of breath)   . Vitamin B12 deficiency   .  Vitamin D deficiency     Past Surgical History:  Procedure Laterality Date  . BACK SURGERY  1990  . PROSTATECTOMY  2003    Family History  Problem Relation Age of Onset  . Liver disease Father   . Prostate cancer Brother   . Lung disease Neg Hx   . Rheumatologic disease Neg Hx     Social History   Social History  . Marital status: Single    Spouse name: N/A  . Number of children: N/A  . Years of education: N/A   Occupational History  . [employer]     Stocking  . [employer]     retired   Social History Main Topics  . Smoking status: Former Smoker    Packs/day: 1.00    Years: 40.00    Types: Cigarettes    Quit date:  01/02/2002  . Smokeless tobacco: Never Used  . Alcohol use No  . Drug use: No  . Sexual activity: Not Asked   Other Topics Concern  . None   Social History Narrative   Originally from [place]. Always lived in [place]. Prior travel to , [place], [place], [place], & [place]. No international travel. 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 No known asbestos exposure. Remote exposure to birds/parakeets briefly. Has a dog currently. No mold exposure.       Objective:   Physical Exam BP 140/88 (BP Location: Right Arm, Patient Position: Sitting, Cuff Size: Normal)   Pulse 93   Ht '5\' 6"'  (1.676 m)   Wt 197 lb (89.4 kg)   SpO2 94%   BMI 31.80 kg/m   Gen.: No distress. Comfortable. Central obesity persists. Integument: Warm and dry. Without rash on exposed skin. Residual various ages noted on hands. HEENT: Poor dentition. Moist mucous membranes. No oral ulcers. No scleral icterus. Cardiovascular: Regular rate. Trace lower extremity edema. 3/6 systolic ejection murmur noted again. Pulmonary: Persistent mild basilar Velcro crackles. Normal work of breathing on room air. Speaking in complete sentences. Abdomen: Soft. Protuberant. Normal bowel sounds.  PFT 05/22/16: FVC 2.31 L (64%) FEV1 1.86 L (72%) FEV1/FVC 0.81 FEF 25-75 1.82 L (98%)                                                                                                             DLCO corrected 35% 02/20/16: FVC 2.57 L (71%) FEV1 2.14 L (83%) FEV1/FVC 0.83 FEF 25-75 2.36 L (126%)  DLCO corrected 43% (Hgb 14.3) 11/20/15: FVC 2.55 L (71%) FEV1 2.13 L (82%) FEV1/FVC 0.84 FEF 25-75 2.40 L (128%)                                                                                                           DLCO corrected 37% (Hgb 15.7) 07/17/15: FVC 2.65 L (73%) FEV1 2.18 L (84%) FEV1/FVC 0.83 FEF 25-75 2.42 L (128%) no bronchodilator  response TLC 4.09 L (65%) RV 56% ERV 63% DLCO corrected 41% (Hgb 16.3)  6MWT 05/22/16:  Baseline Sat 84% on RA / Required 2 L/m pulse dose at rest / Requred 6 L/m continuous flow with exertion to maintain Sat >90% 11/20/15:  96 meters / Baseline Sat 95% on RA / Nadir Sat 83% on RA (required 2 L/m to maintain saturation & took multiple breaks during the walk with pain) 07/17/15: 192 meters / Baseline Sat 95% on RA / Nadir Sat 82% on RA at End of Walk (required 2 L/m to maintain w/ exertion) 08/23/14:  Nadir saturation 86% on RA - Saturated 93% on 2 L/m continuous  IMAGING CXR PA/LAT 03/22/16 (personally reviewed by me): Chronic prominent bilateral interstitial markings and reticulation. No focal opacity or mass appreciated. No pleural effusion. Heart normal in size & mediastinum normal in contour.  HRCT CHEST W/O 06/26/15 (previously reviewed by me): Borderline enlarged mediastinal lymph nodes measuring up to 1.2 cm in short axis. Slightly basilar predominant subpleural reticular changes with some traction bronchiectasis and minimal groundglass. No appreciable honeycombing changes. No other parenchymal nodule or opacity appreciated. Centrilobular emphysema is also present. No pleural effusion or thickening. No pericardial effusion.  CT CHEST W/O 12/31/13 (per radiologist):Numerous scattered lymph nodes in mediastinum including 1.2 cm short axis AP window lymph node. Coarse peripheral interstitial lung disease likely with early honeycombing. Underlying centrilobular emphysema. Nodule in left midlung. No other separate nodule identified. Thoracic spondylosis.  CARDIAC TTE (06/26/15): Mild LVH. EF 60-65%. LA mildly dilated. Mild to moderate aortic stenosis with mild regurgitation. Trivial mitral regurgitation. Left atrium mildly dilated.  LABS 11/15/15 CBC: 11.3/15.0/44.8/231 BMP: 142/4.7/100/26/19/1.07/111/10.5 LFT: 4.0/7.0/0.6/65/17/13  05/15/15 CBC: 10.6/15.7/46.5/250 BMP:  139/4.8/98/28/14/1.02/117/10.3 LFT: 4.1/7.4/0.5/69/15/13  11/16/14 BMP: 140/4.5/101/23/11/0.79/119/9.2 LFT: 3.9/6.4/0.5/56/16/13 CBC: 9.0/14.8/45.2/199  01/26/14 CK: 104 ESR: 87 ANA: Negative Anti-CCP: 231.3 RF: 503    Assessment & Plan:  76 y.o. male with ILD secondary to rheumatoid arthritis, chronic hypoxic respiratory failure, immunosuppressive status, & GERD. Patient's oxygen requirement has increased significantly today and with the substantial decline in his spirometry I do question whether or not his interstitial lung disease is getting worse. I do believe he will need increased doses of immunosuppression in an effort to stave off further worsening in his pulmonary function. My concern is that his previous acute bronchitis likely caused a flare of his interstitial lung disease. Reviewing his previous x-ray shows no focal opacities to suggest a pneumonia. He is continuing on his PCP prophylaxis and continues to have minimal symptoms from his reflux. I did advise the patient to notify me if he had any new breathing problems  or questions before his next appointment.  1. ILD: Continuing current dose of CellCept. Increasing dose of prednisone to 40 mg daily until I see him back for further immunosuppression. Repeat spirometry with DLCO at next appointment. 2. Chronic hypoxic respiratory failure: Increasing oxygen to 2 L/m at rest and 6 L/m with exertion continuous flow. Also prescribing the patient and oxygen conserving device. 3. Immunosuppressed status: Continuing on PCP prophylaxis with Bactrim DS 1 tab by mouth on Monday, Wednesday, and Friday. Patient has lab work pending with his PCP tomorrow. 4. GERD: Minimal symptoms. Continuing to use Prilosec as needed. 5. Health maintenance: Status post Influenza Vaccine September 2017, Pneumovax September 2015 & Prevnar March 2017.  6. Follow-up: Return to clinic in 6 weeks or sooner if needed.  Sonia Baller Ashok Cordia, M.D. [facility] Pager:  [phone] After 3pm or if no response, call [phone] 11:03 AM 05/22/16

## 2016-05-22 NOTE — Telephone Encounter (Signed)
Please check with the patient before you send this information and make sure he is clear on what they are requesting. If he is ok with sending it then I am as well. J.

## 2016-05-23 ENCOUNTER — Telehealth: Payer: Self-pay | Admitting: Pulmonary Disease

## 2016-05-23 NOTE — Telephone Encounter (Signed)
LMOM TCB x1 for pt I worked on this extensively yesterday - he was following up with his PCP this morning and I personally reached out to that office to assist with the face-to-face for the motorized wheelchair/scooter.  Our office was not aware this face-to-face needed to take place until after pt was already seen by JN.  Unsure about the O2 concerns as this was not asked by the patient in our conversation yesterday.  Will be happy to speak with him when he calls back.

## 2016-05-23 NOTE — Telephone Encounter (Signed)
Spoke with Janett Billow regarding this message- see several previous telephone encounters regarding this issue with scooter.   Per Janett Billow, she will call patient regarding this issue.  Will hold message in triage for Jess to handle.

## 2016-05-23 NOTE — Telephone Encounter (Signed)
Spoke with the pt  He states that he had a visit with Dr Inda Merlin today, and he is pretty sure the scooter issue is resolved  He will let us know if there is anything we need to do regarding the scooter   He states that [facility] called him last night around 7 pm and told him that they need further information from [place]  I called [employer] and was on hold for 5 min without getting to speak with anyone  WCB later

## 2016-05-23 NOTE — Telephone Encounter (Signed)
Patient returning call - he can be reached at [phone] -pr

## 2016-05-24 NOTE — Telephone Encounter (Signed)
Called and spoke with Jeff Lynch from [facility] and she stated that they did not need anything further from out office.  They are going out today to fulfill the order that was placed. Nothing further is needed.

## 2016-06-05 ENCOUNTER — Telehealth: Payer: Self-pay | Admitting: Pulmonary Disease

## 2016-06-05 NOTE — Telephone Encounter (Signed)
Spoke with pt checking the status of scooter order.  I advised per previous telephone encounters that we have gotten everything to his homecare company and that per Magda Paganini at [facility] they are processing this order.  I advised pt to follow up with his DME company for the order. Pt expressed understanding.  Will close encounter.

## 2016-06-11 ENCOUNTER — Ambulatory Visit (INDEPENDENT_AMBULATORY_CARE_PROVIDER_SITE_OTHER): Payer: Medicare PPO | Admitting: Vascular Surgery

## 2016-06-11 ENCOUNTER — Telehealth: Payer: Self-pay | Admitting: Vascular Surgery

## 2016-06-11 ENCOUNTER — Encounter: Payer: Self-pay | Admitting: Vascular Surgery

## 2016-06-11 VITALS — BP 115/74 | HR 97 | Temp 97.9°F | Resp 20 | Ht 66.0 in | Wt 197.0 lb

## 2016-06-11 DIAGNOSIS — I714 Abdominal aortic aneurysm, without rupture, unspecified: Secondary | ICD-10-CM

## 2016-06-11 NOTE — Telephone Encounter (Signed)
Scheduled Cardiac Clearance for 5/10 @ 3pm ( soonest avail).

## 2016-06-11 NOTE — Progress Notes (Signed)
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 male, who is seen today for evaluation of his abdominal aortic aneurysm. He is a 76 year old gentleman who underwent plain x-ray for evaluation of abdominal pain and vomiting. This revealed a infrarenal aneurysm and he subsequently underwent ultrasound for further evaluation revealing 6.5 cm aneurysm. He is here today for discussion of this. He had no known past history of aneurysm and no known aneurysm history in his family. His main medical difficulty as of severe pulmonary dysfunction related to interstitial lung disease. He is on chronic oxygen therapy although he is not wearing oxygen today. He does have rheumatoid arthritis. 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 He does have history of aortic valvular disease which is not critical and no history of coronary artery disease.  Past Medical History:  Diagnosis Date  . Acute pain of right shoulder   . Cervical pain   . Chronic respiratory failure with hypoxia ([place])   . Cough   . Esophagitis, reflux   . Gout   . Hyperlipemia   . ILD (interstitial lung disease) ([place])    Likely Secondary to RA  . Impotence, organic   . Knee joint pain   . Prostate cancer ([place])   . Rheumatoid arthritis ([place])   . SOB (shortness of breath)   . Vitamin B12 deficiency   . Vitamin D deficiency     Family History  Problem Relation Age of Onset  . Liver disease Father   . Prostate cancer Brother   . Lung disease Neg Hx   . Rheumatologic disease Neg Hx     SOCIAL HISTORY: Social History   Social History  . Marital status: Single    Spouse name: N/A  . Number of children: N/A  . Years of education: N/A   Occupational History    . [employer]     Stocking  . [employer]     retired   Social History Main Topics  . Smoking status: Former Smoker    Packs/day: 1.00    Years: 40.00    Types: Cigarettes    Quit date: 01/02/2002  . Smokeless tobacco: Never Used  . Alcohol use No  . Drug use: No  . Sexual activity: Not on file   Other Topics Concern  . Not on file   Social History Narrative   Originally from [place]. Always lived in [place]. Prior travel to [place], [place], [place], [place], & [place]. No international travel. 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 No known asbestos exposure. Remote exposure to birds/parakeets briefly. Has a dog currently. No mold exposure.     No Known Allergies  Current Outpatient Prescriptions  Medication Sig Dispense Refill  . acetaminophen (TYLENOL) 325 MG tablet Take 325 mg by mouth every 6 (six) hours as needed. Reported on 05/10/2015    . allopurinol (ZYLOPRIM) 100 MG tablet Take 1 tablet by mouth daily.    [name] aspirin 81 MG tablet Take 81 mg by mouth daily.    [name] atorvastatin (LIPITOR) 80 MG tablet Take 80 mg by mouth daily.    [name] CALCIUM PO Take by mouth 2 (two) times daily.    . Cyanocobalamin (VITAMIN B-12 PO) Take 1 tablet  by mouth daily.    . mycophenolate (CELLCEPT) 500 MG tablet TAKE 1 TABLET TWICE DAILY 60 tablet 0  . omeprazole (PRILOSEC) 20 MG capsule Take 1 capsule (20 mg total) by mouth daily. (Patient taking differently: Take 20 mg by mouth daily as needed. As needed) 30 capsule 6  . predniSONE (DELTASONE) 10 MG tablet TAKE 1 TABLET EVERY DAY 90 tablet 0  . predniSONE (DELTASONE) 10 MG tablet Take 4 tablets (40 mg total) by mouth daily with breakfast. 120 tablet 1  . sulfamethoxazole-trimethoprim (BACTRIM DS,SEPTRA DS) 800-160 MG tablet Take 1 tablet by mouth 3 (three) times a week. On Mon, Wed, Friday 12 tablet 3  . Vitamin D, Ergocalciferol, (DRISDOL) 50000 UNITS CAPS capsule Take 50,000 Units by mouth every 7 (seven) days. Reported on 08/07/2015    .  levofloxacin (LEVAQUIN) 750 MG tablet Take 1 tablet (750 mg total) by mouth daily. (Patient not taking: Reported on 05/22/2016) 7 tablet 0   No current facility-administered medications for this visit.     REVIEW OF SYSTEMS:  '[X]'$  denotes positive finding, '[ ]'$  denotes negative finding Cardiac  Comments:  Chest pain or chest pressure:    Shortness of breath upon exertion: x   Short of breath when lying flat:    Irregular heart rhythm:        Vascular    Pain in calf, thigh, or hip brought on by ambulation: x   Pain in feet at night that wakes you up from your sleep:     Blood clot in your veins:    Leg swelling:  x       Pulmonary    Oxygen at home: x   Productive cough:  x   Wheezing:  x       Neurologic    Sudden weakness in arms or legs:     Sudden numbness in arms or legs:     Sudden onset of difficulty speaking or slurred speech:    Temporary loss of vision in one eye:     Problems with dizziness:         Gastrointestinal    Blood in stool:     Vomited blood:         Genitourinary    Burning when urinating:     Blood in urine:        Psychiatric    Major depression:         Hematologic    Bleeding problems:    Problems with blood clotting too easily:        Skin    Rashes or ulcers:        Constitutional    Fever or chills:      PHYSICAL EXAM: Vitals:   06/11/16 1524  BP: 115/74  Pulse: 97  Resp: 20  Temp: 97.9 F (36.6 C)  TempSrc: Oral  SpO2: 96%  Weight: 197 lb (89.4 kg)  Height: '5\' 6"'$  (1.676 m)    GENERAL: The patient is a well-nourished male, in no acute distress. The vital signs are documented above. CARDIOVASCULAR: 2+ radial and 2+ dorsalis pedis pulses bilaterally. Carotid arteries without bruits PULMONARY: There is good air exchange  ABDOMEN: Soft and non-tender . Obese. No aneurysm palpable MUSCULOSKELETAL: There are no major deformities or cyanosis. NEUROLOGIC: No focal weakness or paresthesias are detected. SKIN: There are no  ulcers or rashes noted. PSYCHIATRIC: The patient has a normal affect.  DATA:  Ultrasound shows a 6.5 cm aneurysm. He did have prior  CT scans of his chest and on reviewing these the cuts were down to the level of the celiac artery and his aorta is normal caliber down to the celiac artery but not visualized below this  MEDICAL ISSUES: Had a very long discussion with patient regarding the significance of his large aneurysm. I have recommended CT angiogram for further evaluation to determine if he is a stent graft candidate. Feel that it would be very difficult for him to undergo open repair due to his severe pulmonary dysfunction. We will make further recommendations pending CT scan. I did explain the seizure both open and stent graft repair of aneurysm. Have recommended that he see his cardiologist again we will coordinate this for preoperative risk stratification. Assuming he is felt to be appropriate and acceptable cardiac risk and that his CT scan shows he is a candidate for stent graft in, we would proceed with elective repair.   Rosetta Posner, MD FACS [facility] Tel [phone] Pager [phone]

## 2016-06-12 NOTE — Addendum Note (Signed)
Addended by: Lianne Cure A on: 06/12/2016 01:16 PM   Modules accepted: Orders

## 2016-06-13 ENCOUNTER — Encounter: Payer: Self-pay | Admitting: Family Medicine

## 2016-06-14 ENCOUNTER — Telehealth: Payer: Self-pay | Admitting: Vascular Surgery

## 2016-06-14 NOTE — Telephone Encounter (Signed)
Scheduled CT Scan at [place] on 4/18 @ 1:15pm. Confirmed with pt. Also confirmed cardiology clearance appt on 5/10 @ 3pm with Dr. Jimmie Molly.

## 2016-06-17 ENCOUNTER — Telehealth: Payer: Self-pay | Admitting: Pulmonary Disease

## 2016-06-17 NOTE — Telephone Encounter (Signed)
Called and spoke with pt and he stated that [facility] is who is helping him to get his scooter.  He wanted to call to see if we had heard any updates.  He stated that he will call and check with [facility] on any new updates.

## 2016-07-03 ENCOUNTER — Telehealth: Payer: Self-pay | Admitting: Pulmonary Disease

## 2016-07-03 NOTE — Telephone Encounter (Signed)
A prior authorization for mycophenolate '500mg'$  was faxed to [facility] at [phone]. Will await a decision.

## 2016-07-03 NOTE — Telephone Encounter (Signed)
Spoke with pt and he stated he got a automated call from [facility] that something was approved but it did not say what was approved. I saw Marily Lente documented doing a PA today for Cellcept but unsure if that was approved. Pt will contact them to verify. Nothing further is needed. Also spoke with Carleigh for her to follow up on her PA just in case that was approved

## 2016-07-04 NOTE — Telephone Encounter (Signed)
[employer] was contacted who stated that patient's PA is still in progress. Will try again at a later date.

## 2016-07-08 ENCOUNTER — Encounter: Payer: Self-pay | Admitting: Vascular Surgery

## 2016-07-08 NOTE — Telephone Encounter (Signed)
Patient called to speak to nurse about the medication mycophenolate, have some information to pass on.Marland KitchenMarland KitchenMearl Lynch

## 2016-07-08 NOTE — Telephone Encounter (Signed)
I have spoken with Jeff Lynch, who states [employer] has made him aware that PA for mycophenolate '500mg'$  has been denied. Jeff Lynch has started appeal. Appeal # is [id number] I have spoken with Sharyn Lull with [facility]. Sharyn Lull states PA was denied due to off label use for medication. Sharyn Lull states if Dr. Ashok Cordia would like to proceed with appeal we can fax supportive documents to [phone]. Sharyn Lull states she is unable to tell me what documents to send, due to Jeff Lynch starting appeal.  Denial letter is in [place] look out.   JN please advise if you would like to proceed with appeal. Thanks.   Ref #[id number]

## 2016-07-08 NOTE — Telephone Encounter (Signed)
Yes. Please file appeal. Thanks.

## 2016-07-09 ENCOUNTER — Encounter: Payer: Self-pay | Admitting: Pulmonary Disease

## 2016-07-09 NOTE — Telephone Encounter (Signed)
Letter and CT has been faxed to provided number. Received fax confirmation. Will route to Carleigh to f/u on.

## 2016-07-09 NOTE — Telephone Encounter (Signed)
Letter is complete.  

## 2016-07-09 NOTE — Telephone Encounter (Signed)
[place]  A letter of medical necessity needs to be done and to include what other medications pt has tried and failed.  Please advise what exactly letter should state

## 2016-07-11 NOTE — Telephone Encounter (Signed)
Patient requesting to speak to nurse about the letter, needs some clarification..patient contact # [phone]...ert

## 2016-07-11 NOTE — Telephone Encounter (Signed)
Pt was calling checking to see if we started the appeal. Informed the pt that we have and I do not see where we heard anything as of yet.   Carleigh have you received any faxes about this pt's appeal

## 2016-07-12 NOTE — Telephone Encounter (Signed)
No appeal update was found in JN cubby. [employer] was contacted at [phone] for update. Per representative, Elberta Fortis, appeal information was not received. Appeal information will be resubmitted once completely filled out.

## 2016-07-12 NOTE — Telephone Encounter (Signed)
Updated appeal information was faxed to [facility] at [phone] as expedited. Will contact at later time for decision.

## 2016-07-15 ENCOUNTER — Other Ambulatory Visit: Payer: Self-pay | Admitting: Pulmonary Disease

## 2016-07-15 DIAGNOSIS — J8410 Pulmonary fibrosis, unspecified: Secondary | ICD-10-CM

## 2016-07-15 MED ORDER — MYCOPHENOLATE MOFETIL 500 MG PO TABS: 500.0000 mg | ORAL_TABLET | Freq: Two times a day (BID) | ORAL | 3 refills | Status: DC

## 2016-07-15 NOTE — Telephone Encounter (Signed)
Received fax from [facility] indicating the patient's PA has been approved for his mycophenolate '500mg'$  tablet from 07/15/2016-07/16/2018. Pt was contacted and made aware of this information. Pt was unsure if he had any refills remaining. After reviewing JN last OV, he wanted patient to continue medication. An order was placed for medication refills. Nothing further is needed.

## 2016-07-16 ENCOUNTER — Telehealth: Payer: Self-pay | Admitting: Pulmonary Disease

## 2016-07-16 DIAGNOSIS — J8410 Pulmonary fibrosis, unspecified: Secondary | ICD-10-CM

## 2016-07-16 NOTE — Telephone Encounter (Signed)
lmtcb X1 for pt  

## 2016-07-17 MED ORDER — MYCOPHENOLATE MOFETIL 500 MG PO TABS: 500.0000 mg | ORAL_TABLET | Freq: Two times a day (BID) | ORAL | 3 refills | Status: DC

## 2016-07-17 NOTE — Telephone Encounter (Signed)
Yes. Please send the prescription as he requests. Thanks.

## 2016-07-17 NOTE — Telephone Encounter (Signed)
Spoke with patient-would like to see if Jeff Lynch would send monthly Rx to [place] as he can not afford getting Cellcept through [employer] wise.   Pt has not had medication in several days-pt has appt tomorrow with testing and Jeff Lynch but would like to have Rx sent today so he can get started back on it again.   Jeff Lynch Please advise. Thanks.

## 2016-07-17 NOTE — Telephone Encounter (Signed)
Rx was sent and spoke with the pt and notified that this was done  Nothing further needed

## 2016-07-18 ENCOUNTER — Ambulatory Visit (INDEPENDENT_AMBULATORY_CARE_PROVIDER_SITE_OTHER): Payer: Medicare PPO | Admitting: *Deleted

## 2016-07-18 ENCOUNTER — Encounter: Payer: Self-pay | Admitting: Pulmonary Disease

## 2016-07-18 ENCOUNTER — Ambulatory Visit (INDEPENDENT_AMBULATORY_CARE_PROVIDER_SITE_OTHER): Payer: Medicare PPO | Admitting: Pulmonary Disease

## 2016-07-18 VITALS — BP 118/72 | HR 89 | Ht 66.0 in | Wt 205.6 lb

## 2016-07-18 DIAGNOSIS — J849 Interstitial pulmonary disease, unspecified: Secondary | ICD-10-CM

## 2016-07-18 DIAGNOSIS — K219 Gastro-esophageal reflux disease without esophagitis: Secondary | ICD-10-CM | POA: Diagnosis not present

## 2016-07-18 DIAGNOSIS — J9611 Chronic respiratory failure with hypoxia: Secondary | ICD-10-CM | POA: Diagnosis not present

## 2016-07-18 DIAGNOSIS — D899 Disorder involving the immune mechanism, unspecified: Secondary | ICD-10-CM

## 2016-07-18 DIAGNOSIS — D849 Immunodeficiency, unspecified: Secondary | ICD-10-CM

## 2016-07-18 DIAGNOSIS — J8410 Pulmonary fibrosis, unspecified: Secondary | ICD-10-CM

## 2016-07-18 LAB — PULMONARY FUNCTION TEST
DL/VA % PRED: 65 %
DL/VA: 2.83 ml/min/mmHg/L
DLCO COR % PRED: 37 %
DLCO COR: 10.04 ml/min/mmHg
DLCO unc % pred: 38 %
DLCO unc: 10.31 ml/min/mmHg
FEF 25-75 Pre: 2.13 L/sec
FEF2575-%Pred-Pre: 116 %
FEV1-%PRED-PRE: 77 %
FEV1-Pre: 1.97 L
FEV1FVC-%PRED-PRE: 114 %
FEV6-%Pred-Pre: 71 %
FEV6-Pre: 2.38 L
FEV6FVC-%PRED-PRE: 106 %
FVC-%Pred-Pre: 67 %
FVC-Pre: 2.39 L
PRE FEV6/FVC RATIO: 99 %
Pre FEV1/FVC ratio: 82 %

## 2016-07-18 NOTE — Patient Instructions (Signed)
   I'm keeping you on your current dose of CellCept (Mycophenolate).  I'm keeping you on Prednisone '40mg'$  daily until I see you back in 6 weeks.   Call me if you have any new breathing problems or questions before your next appointment.  TESTS ORDERED: 1. 6MWT with oxygen titration at next appointment with rolling walking

## 2016-07-18 NOTE — Progress Notes (Signed)
SIX MIN WALK 07/18/2016 05/22/2016 11/20/2015 07/17/2015 08/23/2014 01/26/2014  Medications cellcept '500mg'$  and prednisone '40mg'$  taken at 8:00am - NONE Tylenol '325mg'$ , Zyloprim '100mg'$ , ASA 81, Calcium, Vit B12, Cellcept '500mg'$ , Prilosec '20mg'$ , Pravachol '80mg'$  and Prednisone '10mg'$  -- ALL @ 9am - -  Supplimental Oxygen during Test? (L/min) Yes No No No No No  O2 Flow Rate 3 - - - - -  Type Continuous - - - - -  Laps 3 - 2 4 - -  Partial Lap (in Meters) 24 - 0 0 - -  Baseline BP (sitting) 128/68 - 114/72 132/86 - -  Baseline Heartrate 80 - 110 90 - -  Baseline Dyspnea (Borg Scale) 0 - 3 3 - -  Baseline Fatigue (Borg Scale) 0 - 1 0.5 - -  Baseline SPO2 94 - 95 95 - -  BP (sitting) 142/80 - 128/68 144/82 - -  Heartrate 121 - 125 131 - -  Dyspnea (Borg Scale) 4 - 4 5 - -  Fatigue (Borg Scale) 0 - 4 3 - -  SPO2 94 - 83 82 - -  BP (sitting) 136/78 - 110/66 128/72 - -  Heartrate 85 - 102 103 - -  SPO2 100 - 95 94 - -  Stopped or Paused before Six Minutes Yes - Yes No - -  Other Symptoms at end of Exercise apply O2.  - Pt walked only 2 laps and took 2 long breaks during the test d/t SOB and chest tightness - - -  Interpretation Leg pain - - Leg pain - -  Distance Completed 168 - 96 192 - -  Tech Comments: pt started test on roomair, test paused with 4:11 left of test due to pt's O2 dropping to 86 pt was placed on 2L and test resumed. pt O2 dropped to 87 with 2:28 left test was paused at 66 meters and O2 was increased to 3L. test was pefromed with forehead prob, pt did walk with walker. pt c/o leg pain but states this is normal with this amount of exertion.  after 30 secs, sats dropped to 84% ra--increased to 90% on 2lpm pulsed o2 and then steadily dropped to 80% just standing--increased to 5lpm pulsed and sats increased to 96%, walked until 2:51 min remaing and stopped due to knee pain- sats dropped from 90% to 81% on the 5lpm pulsed while resting- pt placed on 6lpm cont o2 and walked until 30 secs remaing and  mainted sat above 90%.  Pt walked 2 laps, took 2 long breaks d/t SOB and chest tightness. Pt's O2 was 83% upon stopping 6MW and took the 2 minutes for the O2 to recover. Pt was then titrated on O2 for 6 min - Pt required 2 liters O2 to maintain O2 above 90% for his level of exertion. Pt again took 2 long breaks during O2 Titration, not allowing his O2 to drop very low, lowest O2 sat was 88%. O2 was turned on to 2 liters at this this point and pt completed 1 lap with O2 on and maintained levels above 90%.  Pt completed full 6MW, no pauses, c/o chest tightness and leg pain which slowed his pace. Pt was titrated on 2Liters O2 at end of test d/t desat (see Patient Care Coordination notes) - pt's O2 controlled with 2 Liters O2 - Pt states that this was a lot of walking compared to his normal activites and amount he walks daily. Pt denied any chest tightness while doing the O2 titration --AMG  O2 sat 86% RA at approx 3/4 of 1st lap with pulse of 101.  2lpm continuous o2 placed on pt. Sats increased to 95% with pulse of 89.   pt walked at his normal pace.  With increased WOB, increased SOB, and increased chest tightness.  No 3rd lap.  Walk completed by Maryfrances Bunnell, RRT.

## 2016-07-18 NOTE — Progress Notes (Signed)
PFT done today. 

## 2016-07-18 NOTE — Progress Notes (Signed)
Subjective:    Patient ID: Jeff Lynch, male    DOB: [birth year], 76 y.o.   MRN: [id number]  C.C.:  Follow-up for ILD, Chronic Hypoxic Respiratory Failure, Immunosuppressed Status, & GERD.  HPI He reports he is supposed to undergo a left heart catheterization tomorrow.   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 Currently prescribed CellCept 500 mg twice a day & started on prednisone 40 mg daily at last appointment. He feel like he has had no change in his baseline dyspnea. Still having intermittent cough producing a "gray" mucus. He reports his recovery time from his dyspnea on exertion is now shorter.  Chronic hypoxic respiratory failure: Previously prescribed oxygen at 2 L/m with exertion. Last appointment patient required 6 L/m with exertion and 2 L/m pulse at rest. Today the patient requires oxygen at 3 L/m with exertion. He reports he is using his oxygen sporadically.   Immunosuppressed status: Currently on prednisone and CellCept. Patient is currently on PCP prophylaxis with Bactrim DS on Monday, Wednesday, and Friday.  GERD: Previously utilizing Prilosec as needed. No reflux or dyspepsia.   Review of Systems  He denies any chest pain or pressure. He reports a "hurting/pressure" in his chest when he gets short of breath. He reports he has had some increased sinus congestion & drainage lately but this has nearly totally resolved. No fever or chills.   No Known Allergies  Current Outpatient Prescriptions on File Prior to Visit  Medication Sig Dispense Refill  . acetaminophen (TYLENOL) 325 MG tablet Take 325 mg by mouth every 6 (six) hours as needed. Reported on 05/10/2015    . allopurinol (ZYLOPRIM) 100 MG tablet Take 1 tablet by mouth daily.    [name] aspirin 81 MG tablet Take 81 mg by mouth daily.    [name] atorvastatin (LIPITOR) 80 MG tablet Take 80 mg by mouth daily.    [name] CALCIUM PO Take by mouth 2 (two) times daily.    . Cyanocobalamin (VITAMIN B-12 PO) Take 1 tablet by mouth  daily.    . mycophenolate (CELLCEPT) 500 MG tablet Take 1 tablet (500 mg total) by mouth 2 (two) times daily. 60 tablet 3  . omeprazole (PRILOSEC) 20 MG capsule Take 1 capsule (20 mg total) by mouth daily. (Patient taking differently: Take 20 mg by mouth daily as needed. As needed) 30 capsule 6  . predniSONE (DELTASONE) 10 MG tablet Take 4 tablets (40 mg total) by mouth daily with breakfast. 120 tablet 1  . sulfamethoxazole-trimethoprim (BACTRIM DS,SEPTRA DS) 800-160 MG tablet Take 1 tablet by mouth 3 (three) times a week. On Mon, Wed, Friday 12 tablet 3  . Vitamin D, Ergocalciferol, (DRISDOL) 50000 UNITS CAPS capsule Take 50,000 Units by mouth every 7 (seven) days. Reported on 08/07/2015     No current facility-administered medications on file prior to visit.     Past Medical History:  Diagnosis Date  . Acute pain of right shoulder   . Cervical pain   . Chronic respiratory failure with hypoxia ([place])   . Cough   . Esophagitis, reflux   . Gout   . Hyperlipemia   . ILD (interstitial lung disease) ([place])    Likely Secondary to RA  . Impotence, organic   . Knee joint pain   . Prostate cancer ([place])   . Rheumatoid arthritis ([place])   . SOB (shortness of breath)   . Vitamin B12 deficiency   . Vitamin D deficiency     Past Surgical History:  Procedure Laterality  Date  . BACK SURGERY  1990  . PROSTATECTOMY  2003    Family History  Problem Relation Age of Onset  . Liver disease Father   . Prostate cancer Brother   . Lung disease Neg Hx   . Rheumatologic disease Neg Hx     Social History   Social History  . Marital status: Single    Spouse name: N/A  . Number of children: N/A  . Years of education: N/A   Occupational History  . [employer]     Stocking  . [employer]     retired   Social History Main Topics  . Smoking status: Former Smoker    Packs/day: 1.00    Years: 40.00    Types: Cigarettes    Quit date: 01/02/2002  . Smokeless tobacco: Never Used  . Alcohol use  No  . Drug use: No  . Sexual activity: Not Asked   Other Topics Concern  . None   Social History Narrative   Originally from [place]. Always lived in [place]. Prior travel to [place], [place], [place], [place], & [place]. No international travel. 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 No known asbestos exposure. Remote exposure to birds/parakeets briefly. Has a dog currently. No mold exposure.       Objective:   Physical Exam BP 118/72 (BP Location: Left Arm, Cuff Size: Normal)   Pulse 89   Ht '5\' 6"'  (1.676 m)   Wt 205 lb 9.6 oz (93.3 kg)   SpO2 95%   BMI 33.18 kg/m   Gen.: Comfortable. No distress. Central obesity. Integument: Warm. Dry. Bruising of various ages on exposed skin of hands and arms. HEENT: Moist membranes. Poor dentition. No scleral icterus. Cardiovascular: Regular rate. Pitting lower extremity edema. No JVD appreciated. Pulmonary: Basilar Velcro crackles unchanged. No accessory muscle use on room air. Abdomen: Soft. Protuberant. Normoactive bowel sounds. Neurological: Cranial nerves grossly intact. No meningismus. Moving all 4 extremities equally.  PFT 07/18/16: FVC 2.39 L (67%) FEV1 1.97 L (77%) FEV1/FVC 0.83 FEF 25-75 2.13 L (116%)                                                                                                                     DLCO corrected 37% 05/22/16: FVC 2.31 L (64%) FEV1 1.86 L (72%) FEV1/FVC 0.81 FEF 25-75 1.82 L (98%)  DLCO corrected 35% 02/20/16: FVC 2.57 L (71%) FEV1 2.14 L (83%) FEV1/FVC 0.83 FEF 25-75 2.36 L (126%)                                                                                                                    DLCO corrected 43% (Hgb 14.3) 11/20/15: FVC 2.55 L (71%) FEV1 2.13 L (82%) FEV1/FVC 0.84 FEF 25-75 2.40 L (128%)                                                                                                                     DLCO corrected 37% (Hgb 15.7) 07/17/15: FVC 2.65 L (73%) FEV1 2.18 L (84%) FEV1/FVC 0.83 FEF 25-75 2.42 L (128%) no bronchodilator response TLC 4.09 L (65%) RV 56% ERV 63% DLCO corrected 41% (Hgb 16.3)  6MWT 07/18/16:  Walked 168 meters / Baseline Sat 94% on RA / Nadir Sat 86% on RA @ 4:11 (required 3 L/m with exertion to maintain) 05/22/16:  Baseline Sat 84% on RA / Required 2 L/m pulse dose at rest / Requred 6 L/m continuous flow with exertion to maintain Sat >90% 11/20/15:  96 meters / Baseline Sat 95% on RA / Nadir Sat 83% on RA (required 2 L/m to maintain saturation & took multiple breaks during the walk with pain) 07/17/15: 192 meters / Baseline Sat 95% on RA / Nadir Sat 82% on RA at End of Walk (required 2 L/m to maintain w/ exertion) 08/23/14:  Nadir saturation 86% on RA - Saturated 93% on 2 L/m continuous  IMAGING CXR PA/LAT 03/22/16 (previously reviewed by me): Chronic prominent bilateral interstitial markings and reticulation. No focal opacity or mass appreciated. No pleural effusion. Heart normal in size & mediastinum normal in contour.  HRCT CHEST W/O 06/26/15 (previously reviewed by me): Borderline enlarged mediastinal lymph nodes measuring up to 1.2 cm in short axis. Slightly basilar predominant subpleural reticular changes with some traction bronchiectasis and minimal groundglass. No appreciable honeycombing changes. No other parenchymal nodule or opacity appreciated. Centrilobular emphysema is also present. No pleural effusion or thickening. No pericardial effusion.  CT CHEST W/O 12/31/13 (per radiologist):Numerous scattered lymph nodes in mediastinum including 1.2 cm short axis AP window lymph node. Coarse peripheral interstitial lung disease likely with early honeycombing. Underlying centrilobular emphysema. Nodule in left midlung. No other separate nodule identified. Thoracic spondylosis.  CARDIAC TTE (06/26/15): Mild LVH. EF 60-65%. LA mildly dilated. Mild  to moderate aortic stenosis with mild regurgitation. Trivial mitral regurgitation. Left atrium mildly dilated.  LABS  11/15/15 CBC: 11.3/15.0/44.8/231 BMP: 142/4.7/100/26/19/1.07/111/10.5 LFT: 4.0/7.0/0.6/65/17/13  05/15/15 CBC: 10.6/15.7/46.5/250 BMP: 139/4.8/98/28/14/1.02/117/10.3 LFT: 4.1/7.4/0.5/69/15/13  11/16/14 BMP: 140/4.5/101/23/11/0.79/119/9.2 LFT: 3.9/6.4/0.5/56/16/13 CBC: 9.0/14.8/45.2/199  01/26/14 CK: 104 ESR: 87 ANA: Negative Anti-CCP: 231.3 RF: 503    Assessment & Plan:  76 y.o. male with ILD secondary to rheumatoid arthritis, chronic hypoxic respiratory failure, immunosuppressive status, & GERD. Some improvement on spirometry and oxygen requirement with walk test. This is encouraging that the prednisone is actually positively affecting active parenchymal damage. I am going to try to hold the patient and his current dose of CellCept for immunosuppression. His oxygen requirement was communicated to him with exertion. I instructed the patient contact my office if he had any new breathing problems or questions before his next appointment.  1. ILD: Continuing patient on prednisone 40 mg daily & CellCept 500 mg twice a day. Repeat 6 minute walk test at next appointment and consider gradual tapering of prednisone. 2. Chronic hypoxic respiratory failure: Patient instructed to use oxygen as prescribed at 3 L/m with exertion. Repeat 6 minute walk test with oxygen titration at next appointment. 3. Immunosuppressed status: Continuing PCP prophylaxis with Bactrim DS on Monday, Wednesday, and Friday. Primary care physician monitoring outpatient labs. 4. GERD: Continuing Prilosec as needed. 5. Health maintenance: Status post Influenza Vaccine September 2017, Pneumovax September 2015 & Prevnar March 2017.  6. Follow-up: Return to clinic in 6 weeks or sooner if needed.  Sonia Baller Ashok Cordia, M.D. [facility] Pager:  [phone] After 3pm or if no response,  call [phone] 1:53 PM 07/18/16

## 2016-07-24 ENCOUNTER — Telehealth: Payer: Self-pay | Admitting: Pulmonary Disease

## 2016-07-24 ENCOUNTER — Other Ambulatory Visit: Payer: Self-pay | Admitting: Pulmonary Disease

## 2016-07-24 MED ORDER — PREDNISONE 10 MG PO TABS: 40.0000 mg | ORAL_TABLET | Freq: Every day | ORAL | 1 refills | Status: DC

## 2016-07-24 NOTE — Telephone Encounter (Signed)
Pt is requesting a 60-day supply of Prednisone 40mg  sent to [facility] Per last OV pt was advised to remain on Prednisone x 6 weeks until follow up. Rx has been sent, Nothing further needed.

## 2016-08-15 ENCOUNTER — Telehealth: Payer: Self-pay | Admitting: Vascular Surgery

## 2016-08-15 NOTE — Telephone Encounter (Signed)
Spoke with Dr. Loleta Chance for cardiology evaluation. He did have a positive the stress test and underwent coronary arteriography. This showed diffuse three-vessel coronary disease which was not felt to be amenable to either endovascular treatment or surgery. Was felt that he should be treated with medical management. In reviewing his CT scan he has a very favorable anatomy for stent graft in. We will have a follow-up office visit with the patient to discuss his cardiac, pulmonary and aortic issues to determine if the stent graft repair is appropriate.

## 2016-08-16 ENCOUNTER — Telehealth: Payer: Self-pay | Admitting: Vascular Surgery

## 2016-08-16 NOTE — Telephone Encounter (Signed)
Sched appt 08/27/16 at 3:30. Spoke to pt to confirm appt.

## 2016-08-16 NOTE — Telephone Encounter (Signed)
-----   Message from Denman George, RN sent at 08/15/2016 11:48 AM EDT ----- Regarding: needs office visit with Dr. Donnetta Hutching Please schedule for office visit with Dr. Donnetta Hutching; the pt. has AAA; appt. is to discuss EVAR.  Please try to book his appt. within next 2-3 weeks.  Thank you.

## 2016-08-27 ENCOUNTER — Encounter: Payer: Self-pay | Admitting: Vascular Surgery

## 2016-08-27 ENCOUNTER — Ambulatory Visit (INDEPENDENT_AMBULATORY_CARE_PROVIDER_SITE_OTHER): Payer: Medicare PPO | Admitting: Vascular Surgery

## 2016-08-27 VITALS — BP 109/69 | HR 58 | Temp 97.0°F | Resp 20 | Ht 66.0 in | Wt 194.0 lb

## 2016-08-27 DIAGNOSIS — I714 Abdominal aortic aneurysm, without rupture, unspecified: Secondary | ICD-10-CM

## 2016-08-27 NOTE — Progress Notes (Signed)
[facility] [place]  Patient name: Jeff Lynch MRN: [id number] DOB: 1940/11/14 Sex: male  REASON FOR VISIT: Continued discussion of abdominal aortic aneurysm  HPI: Jeff Lynch is a 76 y.o. male here today for continued discussion. He has had a CT scan showing favorable anatomy for stent graft repair of his 6.5 cm infrarenal abdominal aortic aneurysm. He then underwent a cardiac evaluation. He had a positive stress test and therefore underwent cardiac catheterization. I spoke with Dr.Wallmeyer regarding this. He does have significant coronary disease but was felt to be too high risk for stenting. I felt that he should be best managed medically and that he would be a candidate for stent graft repair but not open aneurysm repair. Today the patient tells me that he has not had any chest pain. He does however report to recent episodes of bright red blood per rectum. He does have some increased swelling in his legs and thought that his rectal bleeding may be different to change in some of his diuretic medication  Past Medical History:  Diagnosis Date  . Acute pain of right shoulder   . Cervical pain   . Chronic respiratory failure with hypoxia ([place])   . Cough   . Esophagitis, reflux   . Gout   . Hyperlipemia   . ILD (interstitial lung disease) ([place])    Likely Secondary to RA  . Impotence, organic   . Knee joint pain   . Prostate cancer ([place])   . Rheumatoid arthritis ([place])   . SOB (shortness of breath)   . Vitamin B12 deficiency   . Vitamin D deficiency     Family History  Problem Relation Age of Onset  . Liver disease Father   . Prostate cancer Brother   . Lung disease Neg Hx   . Rheumatologic disease Neg Hx     SOCIAL HISTORY: Social History  Substance Use Topics  . Smoking status: Former Smoker    Packs/day: 1.00    Years: 40.00    Types: Cigarettes    Quit date: 01/02/2002  . Smokeless tobacco: Never Used  .  Alcohol use No    No Known Allergies  Current Outpatient Prescriptions  Medication Sig Dispense Refill  . acetaminophen (TYLENOL) 325 MG tablet Take 325 mg by mouth every 6 (six) hours as needed. Reported on 05/10/2015    . allopurinol (ZYLOPRIM) 100 MG tablet Take 1 tablet by mouth daily.    [name] aspirin 81 MG tablet Take 81 mg by mouth daily.    [name] atorvastatin (LIPITOR) 80 MG tablet Take 80 mg by mouth daily.    [name] CALCIUM PO Take by mouth 2 (two) times daily.    . Cyanocobalamin (VITAMIN B-12 PO) Take 1 tablet by mouth daily.    . furosemide (LASIX) 40 MG tablet Take 40 mg by mouth.    . mycophenolate (CELLCEPT) 500 MG tablet Take 1 tablet (500 mg total) by mouth 2 (two) times daily. 60 tablet 3  . omeprazole (PRILOSEC) 20 MG capsule Take 1 capsule (20 mg total) by mouth daily. (Patient taking differently: Take 20 mg by mouth daily as needed. As needed) 30 capsule 6  . predniSONE (DELTASONE) 10 MG tablet TAKE 4 TABLETS EVERY DAY WITH BREAKFAST 240 tablet 1  . Vitamin D, Ergocalciferol, (DRISDOL) 50000 UNITS CAPS capsule Take 50,000 Units by mouth every 7 (seven) days. Reported on 08/07/2015     No current facility-administered medications for this visit.  REVIEW OF SYSTEMS:  [X]  denotes positive finding, [ ]  denotes negative finding Cardiac  Comments:  Chest pain or chest pressure:    Shortness of breath upon exertion:    Short of breath when lying flat:    Irregular heart rhythm:        Vascular    Pain in calf, thigh, or hip brought on by ambulation:    Pain in feet at night that wakes you up from your sleep:     Blood clot in your veins:    Leg swelling:  x         PHYSICAL EXAM: Vitals:   08/27/16 1557  BP: 109/69  Pulse: (!) 58  Resp: 20  Temp: 97 F (36.1 C)  TempSrc: Oral  SpO2: 95%  Weight: 194 lb (88 kg)  Height: 5\' 6"  (1.676 m)    GENERAL: The patient is a well-nourished male, in no acute distress. The vital signs are documented above. CARDIOVASCULAR:  Marked pitting edema in both lower extremities. Abdomen obese with no palpable aneurysm PULMONARY: There is good air exchange  MUSCULOSKELETAL: There are no major deformities or cyanosis. NEUROLOGIC: No focal weakness or paresthesias are detected. SKIN: There are no ulcers or rashes noted. PSYCHIATRIC: The patient has a normal affect.  DATA:  CT scan showing favorable anatomy for stent graft repair  MEDICAL ISSUES: Patient is planning for repair of his 6.5 cm abdominal aortic aneurysm. He is at significant risk due to pulmonary and cardiac status.  He now reports 2 episodes of bright red blood per act him. I have recommended that he discuss this with Dr. Micheal Likens and have it GI evaluation prior to proceeding. Once this is been evaluated we will schedule him for stent graft repair.    Rosetta Posner, MD FACS [facility] Tel [phone] Pager [phone]

## 2016-08-30 ENCOUNTER — Ambulatory Visit (INDEPENDENT_AMBULATORY_CARE_PROVIDER_SITE_OTHER): Payer: Medicare PPO | Admitting: *Deleted

## 2016-08-30 ENCOUNTER — Ambulatory Visit: Payer: Medicare PPO

## 2016-08-30 ENCOUNTER — Ambulatory Visit (INDEPENDENT_AMBULATORY_CARE_PROVIDER_SITE_OTHER): Payer: Medicare PPO | Admitting: Pulmonary Disease

## 2016-08-30 ENCOUNTER — Encounter: Payer: Self-pay | Admitting: Pulmonary Disease

## 2016-08-30 ENCOUNTER — Ambulatory Visit: Payer: Medicare PPO | Admitting: Pulmonary Disease

## 2016-08-30 VITALS — BP 110/60 | HR 98 | Ht 66.0 in | Wt 202.0 lb

## 2016-08-30 DIAGNOSIS — J9611 Chronic respiratory failure with hypoxia: Secondary | ICD-10-CM

## 2016-08-30 DIAGNOSIS — K219 Gastro-esophageal reflux disease without esophagitis: Secondary | ICD-10-CM

## 2016-08-30 DIAGNOSIS — J849 Interstitial pulmonary disease, unspecified: Secondary | ICD-10-CM

## 2016-08-30 DIAGNOSIS — D899 Disorder involving the immune mechanism, unspecified: Secondary | ICD-10-CM

## 2016-08-30 DIAGNOSIS — D849 Immunodeficiency, unspecified: Secondary | ICD-10-CM

## 2016-08-30 DIAGNOSIS — J8410 Pulmonary fibrosis, unspecified: Secondary | ICD-10-CM | POA: Diagnosis not present

## 2016-08-30 DIAGNOSIS — R0609 Other forms of dyspnea: Secondary | ICD-10-CM | POA: Diagnosis not present

## 2016-08-30 NOTE — Progress Notes (Addendum)
Subjective:    Patient ID: Jeff Lynch, male    DOB: 1941/01/17, 76 y.o.   MRN: [id number]  C.C.:  Follow-up for ILD, Chronic Hypoxic Respiratory Failure, Immunosuppressed Status, & GERD.  HPI ILD: Presumed to be secondary to underlying rheumatoid. Prescribed CellCept 500 mg by mouth twice a day & started on prednisone 40 mg daily prior to last appointment. His knees seem to be his main limitation to exertion. He reports his degree of dyspnea hasn't significantly changed. He reports his cough is relatively unchanged.   Chronic hypoxic respiratory failure: Previously patient was on oxygen at 2 L/m with exertion which had increased to 3 L/m with exertion at last appointment. His saturation seems to be doing better but he is now requiring 5 L/m to maintain with exertion at the end of his test. He was prescribed Lasix but has been having trouble regulating his lower extremity edema.   Immunosuppressed status: Patient on prednisone & CellCept. Prescribed PCP prophylaxis with Bactrim DS on Monday, Wednesday, and Friday.  GERD: Previously using Prilosec. No reflux or dyspepsia. He is only using Prilosec intermittently.  Review of Systems  He denies any new chest pain or pressure. No abdominal pain or nausea. He reports he saw blood in his stool twice since last appointment. He is using a motorized scooter to help with mobility. No fever, chills, or sweats.   No Known Allergies  Current Outpatient Prescriptions on File Prior to Visit  Medication Sig Dispense Refill  . acetaminophen (TYLENOL) 325 MG tablet Take 325 mg by mouth every 6 (six) hours as needed. Reported on 05/10/2015    . allopurinol (ZYLOPRIM) 100 MG tablet Take 1 tablet by mouth daily.    [name] aspirin 81 MG tablet Take 81 mg by mouth daily.    [name] atorvastatin (LIPITOR) 80 MG tablet Take 80 mg by mouth daily.    [name] CALCIUM PO Take by mouth 2 (two) times daily.    . Cyanocobalamin (VITAMIN B-12 PO) Take 1 tablet by mouth daily.    .  furosemide (LASIX) 40 MG tablet Take 40 mg by mouth.    . mycophenolate (CELLCEPT) 500 MG tablet Take 1 tablet (500 mg total) by mouth 2 (two) times daily. 60 tablet 3  . omeprazole (PRILOSEC) 20 MG capsule Take 1 capsule (20 mg total) by mouth daily. (Patient taking differently: Take 20 mg by mouth daily as needed. As needed) 30 capsule 6  . predniSONE (DELTASONE) 10 MG tablet TAKE 4 TABLETS EVERY DAY WITH BREAKFAST 240 tablet 1  . Vitamin D, Ergocalciferol, (DRISDOL) 50000 UNITS CAPS capsule Take 50,000 Units by mouth every 7 (seven) days. Reported on 08/07/2015     No current facility-administered medications on file prior to visit.     Past Medical History:  Diagnosis Date  . Acute pain of right shoulder   . Cervical pain   . Chronic respiratory failure with hypoxia ([place])   . Cough   . Esophagitis, reflux   . Gout   . Hyperlipemia   . ILD (interstitial lung disease) ([place])    Likely Secondary to RA  . Impotence, organic   . Knee joint pain   . Prostate cancer ([place])   . Rheumatoid arthritis ([place])   . SOB (shortness of breath)   . Vitamin B12 deficiency   . Vitamin D deficiency     Past Surgical History:  Procedure Laterality Date  . BACK SURGERY  1990  . PROSTATECTOMY  2003  Family History  Problem Relation Age of Onset  . Liver disease Father   . Prostate cancer Brother   . Lung disease Neg Hx   . Rheumatologic disease Neg Hx     Social History   Social History  . Marital status: Single    Spouse name: N/A  . Number of children: N/A  . Years of education: N/A   Occupational History  . [employer]     Stocking  . [employer]     retired   Social History Main Topics  . Smoking status: Former Smoker    Packs/day: 1.00    Years: 40.00    Types: Cigarettes    Quit date: 01/02/2002  . Smokeless tobacco: Never Used  . Alcohol use No  . Drug use: No  . Sexual activity: Not Asked   Other Topics Concern  . None   Social History Narrative   Originally  from [place]. Always lived in [place]. Prior travel to [place], [place], [place], [place], & [place]. No international travel. 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 No known asbestos exposure. Remote exposure to birds/parakeets briefly. Has a dog currently. No mold exposure.       Objective:   Physical Exam BP 110/60 (BP Location: Right Arm, Patient Position: Sitting, Cuff Size: Normal)   Pulse 98   Ht _0  (1.676 m)   Wt 202 lb (91.6 kg)   SpO2 98%   BMI 32.60 kg/m   General:  Awake. Alert. No distress. Central obesity. Integument:  Warm & dry. No rash on exposed skin. Bruising of various ages on the skin above her extremities. Extremities:  No cyanosis or clubbing.  HEENT:  Moist mucus membranes. No oral ulcers. No scleral icterus. Poor dentition. Cardiovascular:  Regular rate. Pitting lower extremity edema. Unable to appreciate JVD.  Pulmonary:  Minimal basilar crackles. Normal work of breathing on room air. Abdomen: Soft. Normal bowel sounds. Protuberant. Musculoskeletal:  Normal bulk and tone. No joint effusion appreciated.  PFT 07/18/16: FVC 2.39 L (67%) FEV1 1.97 L (77%) FEV1/FVC 0.83 FEF 25-75 2.13 L (116%)                                                                                                                     DLCO corrected 37% 05/22/16: FVC 2.31 L (64%) FEV1 1.86 L (72%) FEV1/FVC 0.81 FEF 25-75 1.82 L (98%)  DLCO corrected 35% 02/20/16: FVC 2.57 L (71%) FEV1 2.14 L (83%) FEV1/FVC 0.83 FEF 25-75 2.36 L (126%)                                                                                                                    DLCO corrected 43% (Hgb 14.3) 11/20/15: FVC 2.55 L (71%) FEV1 2.13 L (82%) FEV1/FVC 0.84 FEF 25-75 2.40 L (128%)                                                                                                                    DLCO corrected  37% (Hgb 15.7) 07/17/15: FVC 2.65 L (73%) FEV1 2.18 L (84%) FEV1/FVC 0.83 FEF 25-75 2.42 L (128%) no bronchodilator response TLC 4.09 L (65%) RV 56% ERV 63% DLCO corrected 41% (Hgb 16.3)  6MWT 08/30/16:  Walked 168 meters / Baseline Sat 97% on RA / Nadir Sat 86% on RA @ 5:03 (requrired 5 L/m to maintain) 07/18/16:  Walked 168 meters / Baseline Sat 94% on RA / Nadir Sat 86% on RA @ 4:11 (required 3 L/m with exertion to maintain) 05/22/16:  Baseline Sat 84% on RA / Required 2 L/m pulse dose at rest / Requred 6 L/m continuous flow with exertion to maintain Sat >90% 11/20/15:  96 meters / Baseline Sat 95% on RA / Nadir Sat 83% on RA (required 2 L/m to maintain saturation & took multiple breaks during the walk with pain) 07/17/15: 192 meters / Baseline Sat 95% on RA / Nadir Sat 82% on RA at End of Walk (required 2 L/m to maintain w/ exertion) 08/23/14:  Nadir saturation 86% on RA - Saturated 93% on 2 L/m continuous  IMAGING CXR PA/LAT 03/22/16 (previously reviewed by me): Chronic prominent bilateral interstitial markings and reticulation. No focal opacity or mass appreciated. No pleural effusion. Heart normal in size & mediastinum normal in contour.  HRCT CHEST W/O 06/26/15 (previously reviewed by me): Borderline enlarged mediastinal lymph nodes measuring up to 1.2 cm in short axis. Slightly basilar predominant subpleural reticular changes with some traction bronchiectasis and minimal groundglass. No appreciable honeycombing changes. No other parenchymal nodule or opacity appreciated. Centrilobular emphysema is also present. No pleural effusion or thickening. No pericardial effusion.  CT CHEST W/O 12/31/13 (per radiologist):Numerous scattered lymph nodes in mediastinum including 1.2 cm short axis AP window lymph node. Coarse peripheral interstitial lung disease likely with early honeycombing. Underlying centrilobular emphysema. Nodule in left midlung. No other separate nodule identified. Thoracic  spondylosis.  CARDIAC TTE (06/26/15):  Mild LVH. EF 60-65%. LA mildly dilated. Mild to moderate aortic stenosis with mild regurgitation. Trivial mitral regurgitation. Left atrium mildly dilated.  LABS 08/23/16 CBC: 10.7/14.5/43.1/145 BMP: 139/3.9/96/26/13/1.21/125/9.1 LFT: 3.8/6.0/1.4/63/23/18  06/19/16 BUN:  29 Creatinine 1.3  11/15/15 CBC: 11.3/15.0/44.8/231 BMP: 142/4.7/100/26/19/1.07/111/10.5 LFT: 4.0/7.0/0.6/65/17/13  05/15/15 CBC: 10.6/15.7/46.5/250 BMP: 139/4.8/98/28/14/1.02/117/10.3 LFT: 4.1/7.4/0.5/69/15/13  11/16/14 BMP: 140/4.5/101/23/11/0.79/119/9.2 LFT: 3.9/6.4/0.5/56/16/13 CBC: 9.0/14.8/45.2/199  01/26/14 CK: 104 ESR: 87 ANA: Negative Anti-CCP: 231.3 RF: 503    Assessment & Plan:  76 y.o. 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 Patient's desaturation point with his walk test today has improved. I feel it's reasonable to begin de-escalation of his prednisone at this time. To achieve maximal benefit from immunosuppression I am escalating his dose of CellCept. I'm going to monitor his cell counts and renal function closely on this higher dose of CellCept. We will be monitoring his oxygen requirement closely with further adjustment in his immunosuppression and prednisone therapy. Overall his reflux seems to be well-controlled. I instructed the patient contact me if he had any questions or concerns before his next appointment.   1. ILD:  Escalating dose of CellCept to 1000 mg by mouth twice a day. Starting prednisone taper by 10 mg every 4 weeks. Repeating high-resolution CT chest without contrast to reevaluate underlying parenchymal disease. 2. Chronic hypoxic respiratory failure:  Continuing on oxygen at 5 L/m with exertion. Repeat 6 minute walk test at next appointment. 3. Immunosuppressed status: Continuing Bactrim for PCP prophylaxis. Checking serum CBC and CMP in 1 week. Then monitoring CBC weekly for 1 month and then every 2 weeks  until I see him back. 4. GERD: Continuing Prilosec intermittently as needed.  5. Health maintenance: Status post Influenza Vaccine September 2017, Pneumovax September 2015 & Prevnar March 2017.  6. Follow-up: Return to clinic in 8 weeks or sooner if needed.  Sonia Baller Ashok Cordia, M.D. [facility] Pager:  [phone] After 3pm or if no response, call [phone] 10:28 AM 08/30/16

## 2016-08-30 NOTE — Progress Notes (Signed)
SIX MIN WALK 08/30/2016 07/18/2016 05/22/2016 11/20/2015 07/17/2015 08/23/2014 01/26/2014  Medications None cellcept 500mg  and prednisone 40mg  taken at 8:00am - NONE Tylenol 325mg , Zyloprim 100mg , ASA 81, Calcium, Vit B12, Cellcept 500mg , Prilosec 20mg , Pravachol 80mg  and Prednisone 10mg  -- ALL @ 9am - -  Supplimental Oxygen during Test? (L/min) Yes Yes No No No No No  O2 Flow Rate 5 3 - - - - -  Type Continuous Continuous - - - - -  Laps 3 3 - 2 4 - -  Partial Lap (in Meters) 24 24 - 0 0 - -  Baseline BP (sitting) 120/78 128/68 - 114/72 132/86 - -  Baseline Heartrate 64 80 - 110 90 - -  Baseline Dyspnea (Borg Scale) 3 0 - 3 3 - -  Baseline Fatigue (Borg Scale) 0.5 0 - 1 0.5 - -  Baseline SPO2 97 94 - 95 95 - -  BP (sitting) 130/82 142/80 - 128/68 144/82 - -  Heartrate 118 121 - 125 131 - -  Dyspnea (Borg Scale) 4 4 - 4 5 - -  Fatigue (Borg Scale) 2 0 - 4 3 - -  SPO2 92 94 - 83 82 - -  BP (sitting) 128/80 136/78 - 110/66 128/72 - -  Heartrate 85 85 - 102 103 - -  SPO2 100 100 - 95 94 - -  Stopped or Paused before Six Minutes Yes Yes - Yes No - -  Other Symptoms at end of Exercise Stopped at 5:03 due to SOB, 86% 109p, placed on 5L of o2. Stopped again at 2:53 due to SOB, remained on 5L of o2, 93% 116p. Patient completed walk with the use of a walker. Walked a slow pace.  apply O2.  - Pt walked only 2 laps and took 2 long breaks during the test d/t SOB and chest tightness - - -  Interpretation Leg pain Leg pain - - Leg pain - -  Distance Completed 168 168 - 96 192 - -  Tech Comments: - pt started test on roomair, test paused with 4:11 left of test due to pt's O2 dropping to 86 pt was placed on 2L and test resumed. pt O2 dropped to 87 with 2:28 left test was paused at 66 meters and O2 was increased to 3L. test was pefromed with forehead prob, pt did walk with walker. pt c/o leg pain but states this is normal with this amount of exertion.  after 30 secs, sats dropped to 84% ra--increased to 90% on 2lpm  pulsed o2 and then steadily dropped to 80% just standing--increased to 5lpm pulsed and sats increased to 96%, walked until 2:51 min remaing and stopped due to knee pain- sats dropped from 90% to 81% on the 5lpm pulsed while resting- pt placed on 6lpm cont o2 and walked until 30 secs remaing and mainted sat above 90%.  Pt walked 2 laps, took 2 long breaks d/t SOB and chest tightness. Pt's O2 was 83% upon stopping 6MW and took the 2 minutes for the O2 to recover. Pt was then titrated on O2 for 6 min - Pt required 2 liters O2 to maintain O2 above 90% for his level of exertion. Pt again took 2 long breaks during O2 Titration, not allowing his O2 to drop very low, lowest O2 sat was 88%. O2 was turned on to 2 liters at this this point and pt completed 1 lap with O2 on and maintained levels above 90%.  Pt completed full 6MW, no pauses,  c/o chest tightness and leg pain which slowed his pace. Pt was titrated on 2Liters O2 at end of test d/t desat (see Patient Care Coordination notes) - pt's O2 controlled with 2 Liters O2 - Pt states that this was a lot of walking compared to his normal activites and amount he walks daily. Pt denied any chest tightness while doing the O2 titration --AMG O2 sat 86% RA at approx 3/4 of 1st lap with pulse of 101.  2lpm continuous o2 placed on pt. Sats increased to 95% with pulse of 89.   pt walked at his normal pace.  With increased WOB, increased SOB, and increased chest tightness.  No 3rd lap.  Walk completed by Maryfrances Bunnell, RRT.

## 2016-08-30 NOTE — Addendum Note (Signed)
Addended by: Tyson Dense on: 08/30/2016 12:26 PM   Modules accepted: Orders

## 2016-08-30 NOTE — Patient Instructions (Addendum)
   We are increasing your CellCept to 2 pills (1000mg ) twice daily.  We are going to start tapering your Prednisone slowly as follows:  3 pills (30 mg) daily for 4 weeks starting today, then  2 pills (20 mg) daily until I see you back   Call me if you have any new breathing problems or questions before your next appointment.  TESTS ORDERED: 1. 6MWT with oxygen titration at next appointment  2. HRCT Chest w/o with prone & supine imaging  3. CBC with differential & CMP in 1 week, then CBC every week for 1 month, then every 2 weeks until he comes back.

## 2016-09-02 MED ORDER — MYCOPHENOLATE MOFETIL 500 MG PO TABS: 1000.0000 mg | ORAL_TABLET | Freq: Two times a day (BID) | ORAL | 6 refills | Status: DC

## 2016-09-02 NOTE — Addendum Note (Signed)
Addended by: Tyson Dense on: 09/02/2016 10:24 AM   Modules accepted: Orders

## 2016-09-06 ENCOUNTER — Other Ambulatory Visit (INDEPENDENT_AMBULATORY_CARE_PROVIDER_SITE_OTHER): Payer: Medicare PPO

## 2016-09-06 ENCOUNTER — Telehealth: Payer: Self-pay | Admitting: Vascular Surgery

## 2016-09-06 ENCOUNTER — Ambulatory Visit (INDEPENDENT_AMBULATORY_CARE_PROVIDER_SITE_OTHER)
Admission: RE | Admit: 2016-09-06 | Discharge: 2016-09-06 | Disposition: A | Discharge status: Home / Self Care | Payer: Medicare PPO | Source: Ambulatory Visit | Attending: Pulmonary Disease | Admitting: Pulmonary Disease

## 2016-09-06 DIAGNOSIS — J849 Interstitial pulmonary disease, unspecified: Secondary | ICD-10-CM

## 2016-09-06 LAB — CBC WITH DIFFERENTIAL/PLATELET
Basophils Absolute: 0 10*3/uL (ref 0.0–0.1)
Basophils Relative: 0.3 % (ref 0.0–3.0)
Eosinophils Absolute: 0.2 10*3/uL (ref 0.0–0.7)
Eosinophils Relative: 2.1 % (ref 0.0–5.0)
HCT: 42.5 % (ref 39.0–52.0)
Hemoglobin: 14.2 g/dL (ref 13.0–17.0)
Lymphocytes Relative: 21.4 % (ref 12.0–46.0)
Lymphs Abs: 2.4 10*3/uL (ref 0.7–4.0)
MCHC: 33.5 g/dL (ref 30.0–36.0)
MCV: 90.6 fl (ref 78.0–100.0)
Monocytes Absolute: 1.1 10*3/uL — ABNORMAL HIGH (ref 0.1–1.0)
Monocytes Relative: 10 % (ref 3.0–12.0)
Neutro Abs: 7.3 10*3/uL (ref 1.4–7.7)
Neutrophils Relative %: 66.2 % (ref 43.0–77.0)
Platelets: 205 10*3/uL (ref 150.0–400.0)
RBC: 4.69 Mil/uL (ref 4.22–5.81)
RDW: 15.2 % (ref 11.5–15.5)
WBC: 11 10*3/uL — ABNORMAL HIGH (ref 4.0–10.5)

## 2016-09-06 LAB — COMPREHENSIVE METABOLIC PANEL
ALBUMIN: 3.6 g/dL (ref 3.5–5.2)
ALK PHOS: 56 U/L (ref 39–117)
ALT: 11 U/L (ref 0–53)
AST: 12 U/L (ref 0–37)
BUN: 18 mg/dL (ref 6–23)
CHLORIDE: 105 meq/L (ref 96–112)
CO2: 30 mEq/L (ref 19–32)
Calcium: 9.5 mg/dL (ref 8.4–10.5)
Creatinine, Ser: 1.36 mg/dL (ref 0.40–1.50)
GFR: 54.21 mL/min — AB (ref >60.00)
GLUCOSE: 96 mg/dL (ref 70–99)
POTASSIUM: 4.4 meq/L (ref 3.5–5.1)
SODIUM: 142 meq/L (ref 135–145)
TOTAL PROTEIN: 6.4 g/dL (ref 6.0–8.3)
Total Bilirubin: 0.6 mg/dL (ref 0.2–1.2)

## 2016-09-06 NOTE — Telephone Encounter (Signed)
Per Dr.Gage's office the patient is scheduled to see him today for an appointment. I faxed office notes from Dr.Early's visit on 08/27/16 regarding the rectal bleeding and the request for Dr.Gage to make an GI eval for the patient prior to scheduling the surgery for a AAA stent graft. I will follow up next week w/ Dr.Gage's office to verify the eval w/ GI has been scheduled. awt

## 2016-09-12 NOTE — Progress Notes (Signed)
Spoke with pt about results from CT scan and told pt to continue taking medications currently on. Pt expressed understanding. Nothing further needed at this time.

## 2016-09-12 NOTE — Progress Notes (Signed)
Spoke with patient and informed him of results and recommendations. The patient did not have any questions and verbalized understanding. Nothing further is needed.

## 2016-09-20 ENCOUNTER — Telehealth: Payer: Self-pay | Admitting: Pulmonary Disease

## 2016-09-20 NOTE — Telephone Encounter (Signed)
LABS 09/13/16 CBC: 10.0/14.0/42.9/195 MCV: 93

## 2016-09-25 ENCOUNTER — Other Ambulatory Visit: Payer: Self-pay

## 2016-09-26 ENCOUNTER — Telehealth: Payer: Self-pay | Admitting: Pulmonary Disease

## 2016-09-26 NOTE — Telephone Encounter (Signed)
LABS 09/20/16 CBC: 8.4/14.8/44.2/177 MCV: 92

## 2016-09-26 NOTE — Pre-Procedure Instructions (Signed)
Jeff Lynch  09/26/2016      [facility] [place], [place] [address] Phone: [phone] Fax: [phone]  [place], [place]  [address] Phone: [phone] Fax: [phone]    Your procedure is scheduled on .  Report to [facility] Admitting at 0900 AM .  Call this number if you have problems the morning of surgery:  [phone]   Remember:  Do not eat food or drink liquids after midnight.  Take these medicines the morning of surgery with A SIP OF WATER acetaminophen (tylenol), allopurinal (zyloprim), metoprolol (toprol XL), cellcept, omeprazole (prilosec), prednisone (deltasone),   7 days prior to surgery STOP taking any Aspirin, Aleve, Naproxen, Ibuprofen, Motrin, Advil, Goody's, BC's, all herbal medications, fish oil, and all vitamins   Do not wear jewelry, make-up or nail polish.  Do not wear lotions, powders, or perfumes, or deoderant.    Men may shave face and neck.  Do not bring valuables to the hospital.  [facility] is not responsible for any belongings or valuables.  Contacts, dentures or bridgework may not be worn into surgery.  Leave your suitcase in the car.  After surgery it may be brought to your room.  For patients admitted to the hospital, discharge time will be determined by your treatment team.  Patients discharged the day of surgery will not be allowed to drive home.   Special instructions:   [facility]- Preparing For Surgery  Before surgery, you can play an important role. Because skin is not sterile, your skin needs to be as free of germs as possible. You can reduce the number of germs on your skin by washing with CHG (chlorahexidine gluconate) Soap before surgery.  CHG is an antiseptic cleaner which kills germs and bonds with the skin to continue killing germs even after washing.  Please do  not use if you have an allergy to CHG or antibacterial soaps. If your skin becomes reddened/irritated stop using the CHG.  Do not shave (including legs and underarms) for at least 48 hours prior to first CHG shower. It is OK to shave your face.  Please follow these instructions carefully.   1. Shower the NIGHT BEFORE SURGERY and the MORNING OF SURGERY with CHG.   2. If you chose to wash your hair, wash your hair first as usual with your normal shampoo.  3. After you shampoo, rinse your hair and body thoroughly to remove the shampoo.  4. Use CHG as you would any other liquid soap. You can apply CHG directly to the skin and wash gently with a scrungie or a clean washcloth.   5. Apply the CHG Soap to your body ONLY FROM THE NECK DOWN.  Do not use on open wounds or open sores. Avoid contact with your eyes, ears, mouth and genitals (private parts). Wash genitals (private parts) with your normal soap.  6. Wash thoroughly, paying special attention to the area where your surgery will be performed.  7. Thoroughly rinse your body with warm water from the neck down.  8. DO NOT shower/wash with your normal soap after using and rinsing off the CHG Soap.  9. Pat yourself dry with a CLEAN TOWEL.   10. Wear CLEAN PAJAMAS   11. Place CLEAN SHEETS on your bed the night of your first shower and DO NOT  SLEEP WITH PETS.    Day of Surgery: Do not apply any deodorants/lotions. Please wear clean clothes to the hospital/surgery center.     Please read over the following fact sheets that you were given. Pain Booklet, Coughing and Deep Breathing, MRSA Information and Surgical Site Infection Prevention

## 2016-09-27 ENCOUNTER — Encounter (HOSPITAL_COMMUNITY): Payer: Self-pay

## 2016-09-27 ENCOUNTER — Encounter (HOSPITAL_COMMUNITY)
Admission: RE | Admit: 2016-09-27 | Discharge: 2016-09-27 | Disposition: A | Discharge status: Home / Self Care | Payer: Medicare PPO | Source: Ambulatory Visit | Attending: Vascular Surgery | Admitting: Vascular Surgery

## 2016-09-27 DIAGNOSIS — I714 Abdominal aortic aneurysm, without rupture: Secondary | ICD-10-CM | POA: Diagnosis not present

## 2016-09-27 DIAGNOSIS — Z01812 Encounter for preprocedural laboratory examination: Secondary | ICD-10-CM | POA: Insufficient documentation

## 2016-09-27 HISTORY — DX: Nonrheumatic aortic (valve) stenosis: I35.0

## 2016-09-27 HISTORY — DX: Gastro-esophageal reflux disease without esophagitis: K21.9

## 2016-09-27 HISTORY — DX: Atherosclerotic heart disease of native coronary artery without angina pectoris: I25.10

## 2016-09-27 HISTORY — DX: Dyspnea, unspecified: R06.00

## 2016-09-27 LAB — APTT: aPTT: 28 seconds (ref 24–36)

## 2016-09-27 LAB — BLOOD GAS, ARTERIAL
ACID-BASE DEFICIT: 1.6 mmol/L (ref 0.0–2.0)
BICARBONATE: 22 mmol/L (ref 20.0–28.0)
DRAWN BY: 470591
O2 SAT: 97.1 %
PCO2 ART: 33.5 mmHg (ref 32.0–48.0)
PH ART: 7.433 (ref 7.350–7.450)
PO2 ART: 90.9 mmHg (ref 83.0–108.0)
Patient temperature: 98.6

## 2016-09-27 LAB — COMPREHENSIVE METABOLIC PANEL
ALK PHOS: 57 U/L (ref 38–126)
ALT: 15 U/L — AB (ref 17–63)
ANION GAP: 10 (ref 5–15)
AST: 24 U/L (ref 15–41)
Albumin: 3.6 g/dL (ref 3.5–5.0)
BILIRUBIN TOTAL: 0.8 mg/dL (ref 0.3–1.2)
BUN: 11 mg/dL (ref 6–20)
CALCIUM: 9.4 mg/dL (ref 8.9–10.3)
CO2: 20 mmol/L — ABNORMAL LOW (ref 22–32)
CREATININE: 1.26 mg/dL — AB (ref 0.61–1.24)
Chloride: 106 mmol/L (ref 101–111)
GFR, EST NON AFRICAN AMERICAN: 54 mL/min — AB (ref >60)
Glucose, Bld: 127 mg/dL — ABNORMAL HIGH (ref 65–99)
Potassium: 4.4 mmol/L (ref 3.5–5.1)
Sodium: 136 mmol/L (ref 135–145)
TOTAL PROTEIN: 6.4 g/dL — AB (ref 6.5–8.1)

## 2016-09-27 LAB — URINALYSIS, ROUTINE W REFLEX MICROSCOPIC
Bilirubin Urine: NEGATIVE
Glucose, UA: NEGATIVE mg/dL
HGB URINE DIPSTICK: NEGATIVE
Ketones, ur: NEGATIVE mg/dL
LEUKOCYTES UA: NEGATIVE
Nitrite: NEGATIVE
PROTEIN: NEGATIVE mg/dL
Specific Gravity, Urine: 1.015 (ref 1.005–1.030)
pH: 6 (ref 5.0–8.0)

## 2016-09-27 LAB — SURGICAL PCR SCREEN
MRSA, PCR: NEGATIVE
STAPHYLOCOCCUS AUREUS: NEGATIVE

## 2016-09-27 LAB — CBC
HCT: 43.4 % (ref 39.0–52.0)
HEMOGLOBIN: 14.3 g/dL (ref 13.0–17.0)
MCH: 29.9 pg (ref 26.0–34.0)
MCHC: 32.9 g/dL (ref 30.0–36.0)
MCV: 90.8 fL (ref 78.0–100.0)
Platelets: 197 10*3/uL (ref 150–400)
RBC: 4.78 MIL/uL (ref 4.22–5.81)
RDW: 13.9 % (ref 11.5–15.5)
WBC: 11.7 10*3/uL — ABNORMAL HIGH (ref 4.0–10.5)

## 2016-09-27 LAB — ABO/RH: ABO/RH(D): O POS

## 2016-09-27 LAB — PROTIME-INR
INR: 1.02
Prothrombin Time: 13.4 seconds (ref 11.4–15.2)

## 2016-09-27 LAB — PREPARE RBC (CROSSMATCH)

## 2016-09-27 NOTE — Progress Notes (Signed)
PCP: Dr. Wende Neighbors @ [facility] in [place] @[place]   Cardiology in [place],  Pulmonary:Dr [place] @ [facility]

## 2016-09-27 NOTE — Progress Notes (Addendum)
Anesthesia PAT Evaluation: Patient is a 76 year old male scheduled for EVAR AAA on 10/02/16 by Dr. Curt Jews.   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  - PCP is Dr. Wende Neighbors. - Pulmonologist is Dr. Tera Partridge, last visit 08/30/16. - Cardiologist is Dr. Gabrielle Dare with [facility] (see Care Everywhere), last visit 09/17/16. He was seeing Dr. Danie Binder up until his retirement last month. He wrote: "Patient appears to have asymptomatic to minimally symptomatic severe single vessel CAD consisting of a long 90% LAD stenosis in the proximal and mid vessel. One of my partners who recently saw the patient and perform cardiac catheterization. He felt that continued medical management and proceeding with AAA endovascular repair would be the overall safest approach. For although he is a quite sedentary due to his interstitial lung disease.Marland KitchenMarland KitchenPre-op evaluation Plan: Difficult situation. The patient is clearly at increased but certainly not prohibitive risk from suffering a cardiovascular complication from his AAA endovascular repair overall however I would concur that continue medical therapy and proceeding with his aneurysm repair is the highest priority at this point. He has single-vessel disease and preserved LV function . I feel that the risk of acute perioperative MI is acceptably low. In the future after AAA repair, we can certainly look at percutaneous repair of his LAD stenosis although the length is approximately 30-35 mm and will accordingly be associated with an elevated risk of restenosis and also stent thrombosis. He is not a candidate for CABG."  Meds include allopurinol, aspirin  81 mg, Lipitor, Lasix, Toprol-XL, CellCept, nitroglycerin, Prilosec, KCl, prednisone.  BP 109/62   Pulse (!) 59   Temp 36.8 C   Resp 20   Ht 5' 7"  (1.702 m)   Wt 195 lb (88.5 kg)   SpO2 99%   BMI 30.54 kg/m  Patient is a hospital wheelchair. No conversational dyspnea noted. He was not on supplemental O2. RA O2 sat 99%. Lungs with minimal bibasilar crackles. No wheezing. Heart RRR. II/VI SEM. No carotid bruits noted. Generalized BLE edema, 1-2+, L>R. He has an upper partial. 3 right sided lower incisors, at least one of which he says is loose. He reports activity limited due to his breathing. 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 He denied any recent chest pain, but previously would notice mild exertional chest pains if he was not wearing his oxygen with activity. He feels his breathing recovers pretty quickly with rest even if he is not wearing oxygen. He says that he is able to lie flat.   EKG 07/11/16 [facility]): SR, negative precordial T waves.   Cardiac cath 07/19/16 [facility]; Care Everywhere): Findings: LMCA:Lesion on LMCA: Proximal subsection.30% stenosis 12 mm length . The lesion was lightly calcified. HCW:CBJSEG on Prox LAD: 90% stenosis 35 mm length. The lesion was heavily calcified.The lesion showed with irregular contour. BTD:VVOHYW on 1st Ob Marg: 30% stenosis 12 mm length . The lesion showed with irregular contour. Lesion on Mid CX: 60% stenosis 11 mm length . Bifurcation lesion. Lesion on 2nd Ob Marg: Ostial.30% stenosis 3 mm length . Bifurcation lesion. VPX:TGGYIR on Prox RCA: 50% stenosis 2 mm length . Poor run off was present.The lesion was eccentric. Conclusions  Long diffuse Severe stenosis of the Proximal LAD. Occluded diagonal. Moderate stenosis of the Circumflex. Left Dominant. Normal LV function. LV ejection fraction is 75 %. Recommendations Will start Medical therapy for CAD Risk factor reduction, angina. Medical Therapy because LAD lesion is  a poor target for intervention. Consider high risk PCI of Prox LAD. If fails medical therapy, CTS Consult for Possible CABG.  Nuclear stress test 07/16/16 [facility]): Impression: 1. Functional capacity is not assessed. 2. Normal resting left ventricular size and function, with end-systolic volume of 25 mL an ejection fraction of 64%. 3. Stress induced hypoperfusion in the anteroseptal segment strongly suggest progression to high-grade LAD stenosis. Abnormal pharmacologic perfusion stress test for potential ischemia in the LAD distribution. Coronary angiographic evaluation prior to proceeding with aortic stent graft will be recommended.  Echo 07/19/16 [facility]): Conclusion: 1. Calcific aortic stenosis remains mild/moderate, AVA 1.1 cm estimated, peak gradient 35.5 mmHg, mean gradient 22._ mmHg, AV peak velocity 2.9_ m/s.  2. Moderate concentric LVH with normal systolic function, EF 19%. Mildly impaired relaxation. 3. Normal right heart size/function, no appreciable tricuspid insufficiency, precludes estimation of pulmonary artery pressure.   Chest CT 09/06/16: IMPRESSION: 1. The appearance of the lungs is very similar to prior studies and remains compatible with underlying interstitial lung disease. 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 The primary differential consideration is that of chronic hypersensitivity pneumonitis, however, that is not favored given the lack of air trapping on today's examination. 2. In addition, there is diffuse bronchial wall thickening with moderate centrilobular and paraseptal emphysema; imaging findings suggestive of concurrent COPD. 3. Aortic atherosclerosis, in addition to left main and 3 vessel coronary artery disease. Assessment for potential risk factor modification, dietary therapy or  pharmacologic therapy may be warranted, if clinically indicated. 4. There are calcifications of the aortic valve and mitral annulus. Echocardiographic correlation for evaluation of potential valvular dysfunction may be warranted if clinically indicated.  CXR 07/17/16 [facility]; report in PACS): Impression: 1. No acute cardiopulmonary disease. 2. Stable changes of interstitial fibrosis when compared to the most recent prior chest radiograph. I spoke with Dr. Donnetta Hutching. From his standpoint, he felt it was okay to not use general anesthesia--he would be okay with local or spinal as felt appropriate by the anesthesiologist.  6MWT 08/30/16: Walked 168 meters / Baseline Sat 97% on RA / Nadir Sat 86% on RA @ 5:03 (requrired 5 L/m to maintain)  PFT 07/18/16: FVC 2.39 L (67%), FEV1 1.97 L (77%), FEV1/FVC 0.82, FEF 25-75 2.13 L (116%), DLCO corrected 37%.  CTA abd/pelvis 06/17/16 [facility]): Impression: Vascular: Infrarenal abdominal aortic aneurysm present with a maximal diameter 5.8 cm. Nonvascular: Basilar pulmonary fibrosis. Tiny hypodensity in the liver. If there is a history of malignancy, six-month follow-up MRI is recommended. There is no history or no risk of malignancy, this abnormality can be considered benign.  Preoperative labs noted. Cr 1.26. WBC 11.7. H/H 14.3/43.4. PLT 197. PT/PTT, UA WNL.Glucose 127. T&S done. ABG pH 7.43, pCO2 33.5, pO2 90.9, HCO3 22.0.   Patient is understandable worried about his surgical/anesthesia risk. He is particularly concerned that if he required GETA that it would be difficult to wean him from a ventilator. He discussed that Dr. Donnetta Hutching thought procedure could be done without general anesthesia. I spoke with Dr. Donnetta Hutching to confirm. He stated that from his standpoint, he felt it  was okay to not use general anesthesia--he would be okay with local or spinal as felt appropriate by the anesthesiologist. With mild-moderate aortic stenosis, he may not be a candidate  for neuraxial anesthesia. It may be possible to consider local with MAC, but advised patient that there is still a possibility that he could require general anesthesia if local/MAC anesthesia was ineffective or he was having difficult lying still or breathing during the procedure. If he ultimately needed general anesthesia then pulmonology could be consulted. I have communicated with Dr. Ashok Cordia. He reports he also discussed with patient the possibility of need for mechanical ventilation, and PCCM would be available if needed. Reviewed as well with anesthesiologist Dr. Marcie Bal.   George Hugh [facility] Short Stay Center/Anesthesiology Phone [phone] 09/27/2016 6:07 PM

## 2016-09-30 NOTE — Anesthesia Preprocedure Evaluation (Addendum)
Anesthesia Evaluation  Patient identified by MRN, date of birth, ID band Patient awake    Reviewed: Allergy & Precautions, NPO status , Patient's Chart, lab work & pertinent test results  Airway Mallampati: II  TM Distance: >3 FB Neck ROM: Full    Dental  (+) Edentulous Upper, Poor Dentition   Pulmonary neg pulmonary ROS, former smoker,     + decreased breath sounds      Cardiovascular + CAD   Rhythm:Regular Rate:Normal     Neuro/Psych negative neurological ROS  negative psych ROS   GI/Hepatic Neg liver ROS, GERD  Medicated,  Endo/Other  negative endocrine ROS  Renal/GU negative Renal ROS     Musculoskeletal negative musculoskeletal ROS (+)   Abdominal   Peds  Hematology negative hematology ROS (+)   Anesthesia Other Findings Day of surgery medications reviewed with the patient.  - ILD - HLD   Reproductive/Obstetrics                           Lab Results  Component Value Date   WBC 11.7 (H) 09/27/2016   HGB 14.3 09/27/2016   HCT 43.4 09/27/2016   MCV 90.8 09/27/2016   PLT 197 09/27/2016   Lab Results  Component Value Date   CREATININE 1.26 (H) 09/27/2016   BUN 11 09/27/2016   NA 136 09/27/2016   K 4.4 09/27/2016   CL 106 09/27/2016   CO2 20 (L) 09/27/2016   Lab Results  Component Value Date   INR 1.02 09/27/2016   EKG: normal sinus rhythm.  Echo: - Left ventricle: Wall thickness was increased in a pattern of mild   LVH. Systolic function was normal. The estimated ejection   fraction was in the range of 60% to 65%. - Aortic valve: Cannot tell if valve is trileaflet right cusp seems   to move the most There was mild to moderate stenosis. There was   mild regurgitation. Valve area (VTI): 1.17 cm^2. Valve area   (Vmax): 1.24 cm^2. Valve area (Vmean): 1.27 cm^2. - Mitral valve: Calcified annulus. - Left atrium: The atrium was mildly dilated.   Anesthesia  Physical Anesthesia Plan  ASA: III  Anesthesia Plan: MAC   Post-op Pain Management:    Induction: Intravenous  PONV Risk Score and Plan: 2 and Ondansetron and Dexamethasone  Airway Management Planned: Simple Face Mask  Additional Equipment: Arterial line  Intra-op Plan:   Post-operative Plan:   Informed Consent: I have reviewed the patients History and Physical, chart, labs and discussed the procedure including the risks, benefits and alternatives for the proposed anesthesia with the patient or authorized representative who has indicated his/her understanding and acceptance.     Plan Discussed with: CRNA  Anesthesia Plan Comments: (See my anesthesia note. Wants to avoid general anesthesia if possible due to pulmonary status. Myra Gianotti, PA-C  Precedex gtt )       Anesthesia Quick Evaluation

## 2016-10-02 ENCOUNTER — Encounter (HOSPITAL_COMMUNITY)
Admission: RE | Disposition: A | Discharge status: Home / Self Care | Payer: Self-pay | Source: Ambulatory Visit | Attending: Vascular Surgery

## 2016-10-02 ENCOUNTER — Inpatient Hospital Stay (HOSPITAL_COMMUNITY): Payer: Medicare PPO | Admitting: Vascular Surgery

## 2016-10-02 ENCOUNTER — Inpatient Hospital Stay (HOSPITAL_COMMUNITY): Payer: Medicare PPO

## 2016-10-02 ENCOUNTER — Encounter (HOSPITAL_COMMUNITY): Payer: Self-pay | Admitting: Critical Care Medicine

## 2016-10-02 ENCOUNTER — Inpatient Hospital Stay (HOSPITAL_COMMUNITY)
Admission: RE | Admit: 2016-10-02 | Discharge: 2016-10-04 | DRG: 269 | Disposition: A | Discharge status: Home / Self Care | Payer: Medicare PPO | Source: Ambulatory Visit | Attending: Vascular Surgery | Admitting: Vascular Surgery

## 2016-10-02 DIAGNOSIS — I350 Nonrheumatic aortic (valve) stenosis: Secondary | ICD-10-CM | POA: Diagnosis present

## 2016-10-02 DIAGNOSIS — K210 Gastro-esophageal reflux disease with esophagitis: Secondary | ICD-10-CM | POA: Diagnosis present

## 2016-10-02 DIAGNOSIS — L7632 Postprocedural hematoma of skin and subcutaneous tissue following other procedure: Secondary | ICD-10-CM | POA: Diagnosis not present

## 2016-10-02 DIAGNOSIS — Z7982 Long term (current) use of aspirin: Secondary | ICD-10-CM | POA: Diagnosis not present

## 2016-10-02 DIAGNOSIS — J849 Interstitial pulmonary disease, unspecified: Secondary | ICD-10-CM | POA: Diagnosis present

## 2016-10-02 DIAGNOSIS — E538 Deficiency of other specified B group vitamins: Secondary | ICD-10-CM | POA: Diagnosis present

## 2016-10-02 DIAGNOSIS — Z8679 Personal history of other diseases of the circulatory system: Secondary | ICD-10-CM

## 2016-10-02 DIAGNOSIS — Z8379 Family history of other diseases of the digestive system: Secondary | ICD-10-CM

## 2016-10-02 DIAGNOSIS — Z8042 Family history of malignant neoplasm of prostate: Secondary | ICD-10-CM

## 2016-10-02 DIAGNOSIS — K625 Hemorrhage of anus and rectum: Secondary | ICD-10-CM | POA: Diagnosis present

## 2016-10-02 DIAGNOSIS — M7989 Other specified soft tissue disorders: Secondary | ICD-10-CM | POA: Diagnosis present

## 2016-10-02 DIAGNOSIS — I2510 Atherosclerotic heart disease of native coronary artery without angina pectoris: Secondary | ICD-10-CM | POA: Diagnosis present

## 2016-10-02 DIAGNOSIS — I714 Abdominal aortic aneurysm, without rupture, unspecified: Secondary | ICD-10-CM | POA: Diagnosis present

## 2016-10-02 DIAGNOSIS — M109 Gout, unspecified: Secondary | ICD-10-CM | POA: Diagnosis present

## 2016-10-02 DIAGNOSIS — E559 Vitamin D deficiency, unspecified: Secondary | ICD-10-CM | POA: Diagnosis present

## 2016-10-02 DIAGNOSIS — Z79899 Other long term (current) drug therapy: Secondary | ICD-10-CM | POA: Diagnosis not present

## 2016-10-02 DIAGNOSIS — Z9889 Other specified postprocedural states: Secondary | ICD-10-CM

## 2016-10-02 DIAGNOSIS — E785 Hyperlipidemia, unspecified: Secondary | ICD-10-CM | POA: Diagnosis present

## 2016-10-02 DIAGNOSIS — N529 Male erectile dysfunction, unspecified: Secondary | ICD-10-CM | POA: Diagnosis present

## 2016-10-02 DIAGNOSIS — M069 Rheumatoid arthritis, unspecified: Secondary | ICD-10-CM | POA: Diagnosis present

## 2016-10-02 DIAGNOSIS — Z8546 Personal history of malignant neoplasm of prostate: Secondary | ICD-10-CM

## 2016-10-02 DIAGNOSIS — Z87891 Personal history of nicotine dependence: Secondary | ICD-10-CM | POA: Diagnosis not present

## 2016-10-02 DIAGNOSIS — N359 Urethral stricture, unspecified: Secondary | ICD-10-CM | POA: Diagnosis present

## 2016-10-02 DIAGNOSIS — Z7952 Long term (current) use of systemic steroids: Secondary | ICD-10-CM | POA: Diagnosis not present

## 2016-10-02 HISTORY — PX: ABDOMINAL AORTIC ENDOVASCULAR STENT GRAFT: SHX5707

## 2016-10-02 HISTORY — PX: CYSTOSCOPY: SHX5120

## 2016-10-02 LAB — CBC
HEMATOCRIT: 33.3 % — AB (ref 39.0–52.0)
HEMOGLOBIN: 10.9 g/dL — AB (ref 13.0–17.0)
MCH: 29.6 pg (ref 26.0–34.0)
MCHC: 32.7 g/dL (ref 30.0–36.0)
MCV: 90.5 fL (ref 78.0–100.0)
PLATELETS: 157 10*3/uL (ref 150–400)
RBC: 3.68 MIL/uL — ABNORMAL LOW (ref 4.22–5.81)
RDW: 13.7 % (ref 11.5–15.5)
WBC: 9.9 10*3/uL (ref 4.0–10.5)

## 2016-10-02 LAB — CREATININE, SERUM
CREATININE: 1.1 mg/dL (ref 0.61–1.24)
GFR calc Af Amer: >60 mL/min (ref >60)

## 2016-10-02 LAB — BASIC METABOLIC PANEL
ANION GAP: 5 (ref 5–15)
BUN: 12 mg/dL (ref 6–20)
CALCIUM: 8.4 mg/dL — AB (ref 8.9–10.3)
CO2: 27 mmol/L (ref 22–32)
Chloride: 109 mmol/L (ref 101–111)
Creatinine, Ser: 1.08 mg/dL (ref 0.61–1.24)
GFR calc Af Amer: >60 mL/min (ref >60)
GFR calc non Af Amer: >60 mL/min (ref >60)
GLUCOSE: 164 mg/dL — AB (ref 65–99)
Potassium: 4.8 mmol/L (ref 3.5–5.1)
Sodium: 141 mmol/L (ref 135–145)

## 2016-10-02 LAB — PROTIME-INR
INR: 1.13
PROTHROMBIN TIME: 14.5 s (ref 11.4–15.2)

## 2016-10-02 LAB — APTT: aPTT: 29 seconds (ref 24–36)

## 2016-10-02 LAB — MAGNESIUM: MAGNESIUM: 1.9 mg/dL (ref 1.7–2.4)

## 2016-10-02 SURGERY — INSERTION, ENDOVASCULAR STENT GRAFT, AORTA, ABDOMINAL
Anesthesia: Monitor Anesthesia Care | Site: Groin

## 2016-10-02 MED ORDER — DEXAMETHASONE SODIUM PHOSPHATE 10 MG/ML IJ SOLN
INTRAMUSCULAR | Status: DC | PRN
  Administered 2016-10-02: 4 mg via INTRAVENOUS

## 2016-10-02 MED ORDER — ENOXAPARIN SODIUM 40 MG/0.4ML ~~LOC~~ SOLN
40.0000 mg | SUBCUTANEOUS | Status: DC
  Administered 2016-10-03 – 2016-10-04 (×2): 40 mg via SUBCUTANEOUS
  Filled 2016-10-02: qty 0.4

## 2016-10-02 MED ORDER — VITAMIN B-12 1000 MCG PO TABS
1000.0000 ug | ORAL_TABLET | Freq: Every day | ORAL | Status: DC
  Administered 2016-10-02 – 2016-10-04 (×3): 1000 ug via ORAL
  Filled 2016-10-02 (×3): qty 1

## 2016-10-02 MED ORDER — LACTATED RINGERS IV SOLN
INTRAVENOUS | Status: DC
  Administered 2016-10-02: 10:00:00 via INTRAVENOUS

## 2016-10-02 MED ORDER — FENTANYL CITRATE (PF) 250 MCG/5ML IJ SOLN
INTRAMUSCULAR | Status: AC
  Filled 2016-10-02: qty 5

## 2016-10-02 MED ORDER — HEPARIN SODIUM (PORCINE) 1000 UNIT/ML IJ SOLN
INTRAMUSCULAR | Status: DC | PRN
  Administered 2016-10-02: 6000 [IU] via INTRAVENOUS

## 2016-10-02 MED ORDER — CHLORHEXIDINE GLUCONATE 4 % EX LIQD: 60.0000 mL | Freq: Once | CUTANEOUS | Status: DC

## 2016-10-02 MED ORDER — PHENYLEPHRINE HCL 10 MG/ML IJ SOLN
INTRAVENOUS | Status: DC | PRN
  Administered 2016-10-02: 10 ug/min via INTRAVENOUS

## 2016-10-02 MED ORDER — SODIUM CHLORIDE 0.9 % IV SOLN
INTRAVENOUS | Status: DC | PRN
  Administered 2016-10-02: 12:00:00

## 2016-10-02 MED ORDER — ATORVASTATIN CALCIUM 80 MG PO TABS
80.0000 mg | ORAL_TABLET | Freq: Every day | ORAL | Status: DC
  Administered 2016-10-03 – 2016-10-04 (×2): 80 mg via ORAL
  Filled 2016-10-02 (×2): qty 1

## 2016-10-02 MED ORDER — MAGNESIUM SULFATE 2 GM/50ML IV SOLN
2.0000 g | Freq: Every day | INTRAVENOUS | Status: DC | PRN
  Filled 2016-10-02: qty 50

## 2016-10-02 MED ORDER — ALBUMIN HUMAN 5 % IV SOLN
INTRAVENOUS | Status: AC
  Filled 2016-10-02: qty 250

## 2016-10-02 MED ORDER — SODIUM CHLORIDE 0.9 % IV SOLN
0.0000 ug/min | INTRAVENOUS | Status: DC
  Administered 2016-10-02: 10 ug/min via INTRAVENOUS

## 2016-10-02 MED ORDER — PHENOL 1.4 % MT LIQD: 1.0000 | OROMUCOSAL | Status: DC | PRN

## 2016-10-02 MED ORDER — MYCOPHENOLATE MOFETIL 250 MG PO CAPS
1000.0000 mg | ORAL_CAPSULE | Freq: Two times a day (BID) | ORAL | Status: DC
  Administered 2016-10-02: 1000 mg via ORAL
  Filled 2016-10-02 (×2): qty 4

## 2016-10-02 MED ORDER — DEXMEDETOMIDINE HCL IN NACL 400 MCG/100ML IV SOLN
INTRAVENOUS | Status: DC | PRN
  Administered 2016-10-02: 0.7 ug/kg/h via INTRAVENOUS

## 2016-10-02 MED ORDER — ACETAMINOPHEN 650 MG RE SUPP: 325.0000 mg | RECTAL | Status: DC | PRN

## 2016-10-02 MED ORDER — ASPIRIN 81 MG PO CHEW
81.0000 mg | CHEWABLE_TABLET | Freq: Every day | ORAL | Status: DC
Start: 2016-10-02 — End: 2016-10-04
  Administered 2016-10-02 – 2016-10-04 (×3): 81 mg via ORAL
  Filled 2016-10-02 (×3): qty 1

## 2016-10-02 MED ORDER — LACTATED RINGERS IV SOLN
INTRAVENOUS | Status: DC | PRN
  Administered 2016-10-02 (×2): via INTRAVENOUS

## 2016-10-02 MED ORDER — GUAIFENESIN-DM 100-10 MG/5ML PO SYRP: 15.0000 mL | ORAL_SOLUTION | ORAL | Status: DC | PRN

## 2016-10-02 MED ORDER — MIDAZOLAM HCL 2 MG/2ML IJ SOLN
INTRAMUSCULAR | Status: AC
  Filled 2016-10-02: qty 2

## 2016-10-02 MED ORDER — ALLOPURINOL 100 MG PO TABS
100.0000 mg | ORAL_TABLET | Freq: Every day | ORAL | Status: DC
  Administered 2016-10-03 – 2016-10-04 (×2): 100 mg via ORAL
  Filled 2016-10-02 (×2): qty 1

## 2016-10-02 MED ORDER — LIDOCAINE-EPINEPHRINE (PF) 1 %-1:200000 IJ SOLN
INTRAMUSCULAR | Status: DC | PRN
  Administered 2016-10-02: 30 mL

## 2016-10-02 MED ORDER — 0.9 % SODIUM CHLORIDE (POUR BTL) OPTIME
TOPICAL | Status: DC | PRN
  Administered 2016-10-02 (×2): 1000 mL

## 2016-10-02 MED ORDER — SODIUM CHLORIDE 0.9 % IV SOLN
INTRAVENOUS | Status: DC
  Administered 2016-10-02: 17:00:00 via INTRAVENOUS

## 2016-10-02 MED ORDER — PREDNISONE 20 MG PO TABS
30.0000 mg | ORAL_TABLET | Freq: Every day | ORAL | Status: DC
  Administered 2016-10-03 – 2016-10-04 (×2): 30 mg via ORAL
  Filled 2016-10-02 (×2): qty 1

## 2016-10-02 MED ORDER — LABETALOL HCL 5 MG/ML IV SOLN: 10.0000 mg | INTRAVENOUS | Status: DC | PRN

## 2016-10-02 MED ORDER — POTASSIUM CHLORIDE CRYS ER 20 MEQ PO TBCR
20.0000 meq | EXTENDED_RELEASE_TABLET | Freq: Every day | ORAL | Status: DC
  Administered 2016-10-02 – 2016-10-04 (×3): 20 meq via ORAL
  Filled 2016-10-02 (×3): qty 1

## 2016-10-02 MED ORDER — DEXTROSE 5 % IV SOLN
1.5000 g | INTRAVENOUS | Status: AC
  Administered 2016-10-02: 1.5 g via INTRAVENOUS
  Filled 2016-10-02: qty 1.5

## 2016-10-02 MED ORDER — FUROSEMIDE 40 MG PO TABS
40.0000 mg | ORAL_TABLET | Freq: Every day | ORAL | Status: DC
  Administered 2016-10-02 – 2016-10-04 (×3): 40 mg via ORAL
  Filled 2016-10-02 (×3): qty 1

## 2016-10-02 MED ORDER — ONDANSETRON HCL 4 MG/2ML IJ SOLN
INTRAMUSCULAR | Status: DC | PRN
  Administered 2016-10-02: 4 mg via INTRAVENOUS

## 2016-10-02 MED ORDER — MIDAZOLAM HCL 5 MG/5ML IJ SOLN
INTRAMUSCULAR | Status: DC | PRN
  Administered 2016-10-02: 0.5 mg via INTRAVENOUS
  Administered 2016-10-02: 1 mg via INTRAVENOUS
  Administered 2016-10-02: 0.5 mg via INTRAVENOUS

## 2016-10-02 MED ORDER — HYDRALAZINE HCL 20 MG/ML IJ SOLN: 5.0000 mg | INTRAMUSCULAR | Status: DC | PRN

## 2016-10-02 MED ORDER — OXYCODONE-ACETAMINOPHEN 5-325 MG PO TABS: 1.0000 | ORAL_TABLET | ORAL | Status: DC | PRN

## 2016-10-02 MED ORDER — LIDOCAINE-EPINEPHRINE (PF) 1 %-1:200000 IJ SOLN
INTRAMUSCULAR | Status: AC
  Filled 2016-10-02: qty 30

## 2016-10-02 MED ORDER — LIDOCAINE HCL (PF) 1 % IJ SOLN
INTRAMUSCULAR | Status: AC
  Filled 2016-10-02: qty 30

## 2016-10-02 MED ORDER — IODIXANOL 320 MG/ML IV SOLN
INTRAVENOUS | Status: DC | PRN
  Administered 2016-10-02: 150 mL via INTRAVENOUS

## 2016-10-02 MED ORDER — 0.9 % SODIUM CHLORIDE (POUR BTL) OPTIME
TOPICAL | Status: DC | PRN
  Administered 2016-10-02: 3000 mL

## 2016-10-02 MED ORDER — POTASSIUM CHLORIDE CRYS ER 20 MEQ PO TBCR: 20.0000 meq | EXTENDED_RELEASE_TABLET | Freq: Every day | ORAL | Status: DC | PRN

## 2016-10-02 MED ORDER — DOCUSATE SODIUM 100 MG PO CAPS
100.0000 mg | ORAL_CAPSULE | Freq: Every day | ORAL | Status: DC
  Filled 2016-10-02 (×2): qty 1

## 2016-10-02 MED ORDER — ALUM & MAG HYDROXIDE-SIMETH 200-200-20 MG/5ML PO SUSP: 15.0000 mL | ORAL | Status: DC | PRN

## 2016-10-02 MED ORDER — MORPHINE SULFATE (PF) 2 MG/ML IV SOLN: 1.0000 mg | INTRAVENOUS | Status: DC | PRN

## 2016-10-02 MED ORDER — SODIUM CHLORIDE 0.9 % IV SOLN: INTRAVENOUS | Status: DC

## 2016-10-02 MED ORDER — LIDOCAINE HCL (PF) 1 % IJ SOLN
INTRAMUSCULAR | Status: DC | PRN
  Administered 2016-10-02: 30 mL

## 2016-10-02 MED ORDER — PROTAMINE SULFATE 10 MG/ML IV SOLN
INTRAVENOUS | Status: DC | PRN
  Administered 2016-10-02: 50 mg via INTRAVENOUS

## 2016-10-02 MED ORDER — PROPOFOL 10 MG/ML IV BOLUS
INTRAVENOUS | Status: AC
  Filled 2016-10-02: qty 20

## 2016-10-02 MED ORDER — LIDOCAINE HCL 2 % EX GEL
CUTANEOUS | Status: DC | PRN
  Administered 2016-10-02 (×2): 1

## 2016-10-02 MED ORDER — METOPROLOL TARTRATE 5 MG/5ML IV SOLN: 2.0000 mg | INTRAVENOUS | Status: DC | PRN

## 2016-10-02 MED ORDER — ONDANSETRON HCL 4 MG/2ML IJ SOLN: 4.0000 mg | Freq: Four times a day (QID) | INTRAMUSCULAR | Status: DC | PRN

## 2016-10-02 MED ORDER — NITROGLYCERIN 0.4 MG SL SUBL: 0.4000 mg | SUBLINGUAL_TABLET | SUBLINGUAL | Status: DC | PRN

## 2016-10-02 MED ORDER — ACETAMINOPHEN 325 MG PO TABS
325.0000 mg | ORAL_TABLET | ORAL | Status: DC | PRN
  Administered 2016-10-03 – 2016-10-04 (×4): 650 mg via ORAL
  Filled 2016-10-02 (×4): qty 2

## 2016-10-02 MED ORDER — METOPROLOL SUCCINATE ER 50 MG PO TB24
50.0000 mg | ORAL_TABLET | Freq: Every day | ORAL | Status: DC
  Administered 2016-10-03 – 2016-10-04 (×2): 50 mg via ORAL
  Filled 2016-10-02 (×2): qty 1

## 2016-10-02 MED ORDER — PANTOPRAZOLE SODIUM 40 MG PO TBEC: 40.0000 mg | DELAYED_RELEASE_TABLET | Freq: Every day | ORAL | Status: DC

## 2016-10-02 MED ORDER — ALBUMIN HUMAN 5 % IV SOLN
INTRAVENOUS | Status: DC | PRN
  Administered 2016-10-02: 15:00:00 via INTRAVENOUS

## 2016-10-02 MED ORDER — SODIUM CHLORIDE 0.9 % IV SOLN
500.0000 mL | Freq: Once | INTRAVENOUS | Status: AC | PRN
  Administered 2016-10-02 (×2): 500 mL via INTRAVENOUS

## 2016-10-02 MED ORDER — FENTANYL CITRATE (PF) 250 MCG/5ML IJ SOLN
INTRAMUSCULAR | Status: DC | PRN
  Administered 2016-10-02 (×5): 25 ug via INTRAVENOUS

## 2016-10-02 MED ORDER — DEXTROSE 5 % IV SOLN
1.5000 g | Freq: Two times a day (BID) | INTRAVENOUS | Status: AC
  Administered 2016-10-02 – 2016-10-03 (×2): 1.5 g via INTRAVENOUS
  Filled 2016-10-02 (×2): qty 1.5

## 2016-10-02 MED ORDER — ALBUMIN HUMAN 5 % IV SOLN
12.5000 g | Freq: Once | INTRAVENOUS | Status: AC
  Administered 2016-10-02: 12.5 g via INTRAVENOUS

## 2016-10-02 SURGICAL SUPPLY — 69 items
ADH SKN CLS APL DERMABOND .7 (GAUZE/BANDAGES/DRESSINGS) ×4
BALLN NEPHROSTOMY (BALLOONS) ×4
BALLOON NEPHROSTOMY (BALLOONS) IMPLANT
CANISTER SUCT 3000ML PPV (MISCELLANEOUS) ×4 IMPLANT
CANNULA VESSEL 3MM 2 BLNT TIP (CANNULA) ×2 IMPLANT
CATH ANGIO 5F BER2 65CM (CATHETERS) ×2 IMPLANT
CATH COUDE FOLEY 2W 5CC 18FR (CATHETERS) ×2 IMPLANT
CATH FOLEY 2W COUNCIL 5CC 16FR (CATHETERS) ×2 IMPLANT
CATH OMNI FLUSH .035X70CM (CATHETERS) ×2 IMPLANT
CATH URET 5FR 28IN OPEN ENDED (CATHETERS) ×4 IMPLANT
CLIP LIGATING EXTRA MED SLVR (CLIP) ×2 IMPLANT
CLIP LIGATING EXTRA SM BLUE (MISCELLANEOUS) ×2 IMPLANT
COVER PROBE W GEL 5X96 (DRAPES) ×4 IMPLANT
DERMABOND ADVANCED (GAUZE/BANDAGES/DRESSINGS) ×4
DERMABOND ADVANCED .7 DNX12 (GAUZE/BANDAGES/DRESSINGS) ×2 IMPLANT
DEVICE CLOSURE PERCLS PRGLD 6F (VASCULAR PRODUCTS) IMPLANT
DRSG TEGADERM 2-3/8X2-3/4 SM (GAUZE/BANDAGES/DRESSINGS) ×6 IMPLANT
ELECT CAUTERY BLADE 6.4 (BLADE) ×2 IMPLANT
ELECT REM PT RETURN 9FT ADLT (ELECTROSURGICAL) ×8
ELECTRODE REM PT RTRN 9FT ADLT (ELECTROSURGICAL) ×4 IMPLANT
EXCLUDER TNK LEG 23MX12X18 (Endovascular Graft) IMPLANT
EXCLUDER TRUNK LEG 23MX12X18 (Endovascular Graft) ×4 IMPLANT
GAUZE SPONGE 2X2 8PLY NS (GAUZE/BANDAGES/DRESSINGS) ×2 IMPLANT
GLOVE BIO SURGEON STRL SZ 6.5 (GLOVE) ×1 IMPLANT
GLOVE BIO SURGEON STRL SZ7.5 (GLOVE) ×6 IMPLANT
GLOVE BIO SURGEONS STRL SZ 6.5 (GLOVE) ×1
GLOVE BIOGEL PI IND STRL 6.5 (GLOVE) IMPLANT
GLOVE BIOGEL PI IND STRL 7.0 (GLOVE) IMPLANT
GLOVE BIOGEL PI INDICATOR 6.5 (GLOVE) ×6
GLOVE BIOGEL PI INDICATOR 7.0 (GLOVE) ×4
GLOVE SS BIOGEL STRL SZ 7.5 (GLOVE) ×2 IMPLANT
GLOVE SUPERSENSE BIOGEL SZ 7.5 (GLOVE) ×4
GOWN STRL REUS W/ TWL LRG LVL3 (GOWN DISPOSABLE) ×6 IMPLANT
GOWN STRL REUS W/TWL LRG LVL3 (GOWN DISPOSABLE) ×16
GRAFT BALLN CATH 65CM (STENTS) IMPLANT
KIT BASIN OR (CUSTOM PROCEDURE TRAY) ×4 IMPLANT
KIT ROOM TURNOVER OR (KITS) ×4 IMPLANT
LEG CONTRALATERAL 16X12X12 (Vascular Products) ×2 IMPLANT
LOOP VESSEL MAXI BLUE (MISCELLANEOUS) ×2 IMPLANT
LOOP VESSEL MINI RED (MISCELLANEOUS) ×2 IMPLANT
NDL PERC 18GX7CM (NEEDLE) ×2 IMPLANT
NEEDLE PERC 18GX7CM (NEEDLE) ×4 IMPLANT
NS IRRIG 1000ML POUR BTL (IV SOLUTION) ×4 IMPLANT
PACK ENDOVASCULAR (PACKS) ×4 IMPLANT
PAD ARMBOARD 7.5X6 YLW CONV (MISCELLANEOUS) ×8 IMPLANT
PENCIL BUTTON HOLSTER BLD 10FT (ELECTRODE) ×2 IMPLANT
PERCLOSE PROGLIDE 6F (VASCULAR PRODUCTS) ×24
SET CYSTO W/LG BORE CLAMP LF (SET/KITS/TRAYS/PACK) ×2 IMPLANT
SHEATH AVANTI 11CM 8FR (MISCELLANEOUS) IMPLANT
SHEATH BRITE TIP 8FR 23CM (MISCELLANEOUS) IMPLANT
SHEATH PINNACLE 8F 10CM (SHEATH) ×2 IMPLANT
STAPLER VISISTAT 35W (STAPLE) IMPLANT
STENT GRAFT BALLN CATH 65CM (STENTS) ×2
STOPCOCK MORSE 400PSI 3WAY (MISCELLANEOUS) ×4 IMPLANT
SUT ETHILON 3 0 PS 1 (SUTURE) IMPLANT
SUT PROLENE 5 0 C 1 24 (SUTURE) ×6 IMPLANT
SUT PROLENE 6 0 CC (SUTURE) ×2 IMPLANT
SUT VIC AB 2-0 CTX 36 (SUTURE) IMPLANT
SUT VIC AB 3-0 SH 18 (SUTURE) IMPLANT
SUT VIC AB 3-0 SH 27 (SUTURE) ×4
SUT VIC AB 3-0 SH 27X BRD (SUTURE) IMPLANT
SUT VICRYL 4-0 PS2 18IN ABS (SUTURE) ×8 IMPLANT
SYR 30ML LL (SYRINGE) ×4 IMPLANT
SYR BULB IRRIGATION 50ML (SYRINGE) ×2 IMPLANT
SYR CONTROL 10ML LL (SYRINGE) ×2 IMPLANT
TRAY FOLEY W/METER SILVER 16FR (SET/KITS/TRAYS/PACK) ×4 IMPLANT
TUBING HIGH PRESSURE 120CM (CONNECTOR) ×4 IMPLANT
WIRE AMPLATZ SS-J .035X180CM (WIRE) ×4 IMPLANT
WIRE BENTSON .035X145CM (WIRE) ×4 IMPLANT

## 2016-10-02 NOTE — Op Note (Signed)
OPERATIVE REPORT  DATE OF SURGERY: 10/02/2016  PATIENT: Jeff Lynch, 76 y.o. male MRN: [id number]  DOB: Nov 12, 1940  PRE-OPERATIVE DIAGNOSIS: Abdominal aortic aneurysm  POST-OPERATIVE DIAGNOSIS:  Same  PROCEDURE: Gore stent graft repair of abdominal aortic aneurysm  SURGEON:  Curt Jews, M.D.  PHYSICIAN ASSISTANT: Avel Peace PA-C  ANESTHESIA:  Local with sedation  EBL: 350 ml  Total I/O In: 6433 [I.V.:1400; IV Piggyback:250] Out: 775 [Urine:425; Blood:350]  BLOOD ADMINISTERED: None  DRAINS: None  SPECIMEN: None  COUNTS CORRECT:  YES  PLAN OF CARE: PACU   PATIENT DISPOSITION:  PACU - hemodynamically stable  PROCEDURE DETAILS: Patient was taken to the operative placed supine position. Attempts at the Foley catheter placement were unsuccessful. Dr. Matilde Sprang was consulted and placed a Foley catheter. The abdomen and both groins were prepped and draped in usual sterile fashion. Using SonoSite ultrasound and local anesthesia the common femoral artery was accessed with 18-gauge needle a guidewire was passed centrally bilaterally. 2 separate Perclose devices were pulled positioned at the 2:00 and 10:00 position for closure at the end of the case. 8 French sheath were passed over the guidewire. A guiding catheter was used to position the Bentson wire into the suprarenal aorta. A Amplatz superstiff wire was exchanged for the Bentson wires bilaterally. Patient was given 6015 venous heparin. After circulation time the 16 French sheath was passed through the right groin and a 12 French sheath through the left groin. The main body device was a 23 x 12 x 18 cm device. This was positioned through the right femoral sheath which was a 16 Sheath Was Pl. in a crosslegged position. This was positioned at the level of the renal arteries. A marker pigtail catheter is positioned through the left femoral sheath and positioned above the level renal arteries. AP projection revealed the level of the  renal arteries. The main body was deployed just below the takeoff of the renal arteries. Next the pigtail catheter was withdrawn into the aneurysm sac and a guiding catheter was used to position the Bentson wire through the contralateral gate. The marker catheter was positioned through the contralateral gate and oral to assure that this was indeed located inside the stent graft. The contralateral limb which was a 12 x 12 cm limb was positioned to the left groin. This was deployed with the appropriate 3 cm overlap within the device related just above the hypogastric artery takeoff. Finally the 16 sheath was withdrawn back to the level THE artery and hand injection showed the level hypogastric artery takeoff. The ipsilateral limb was deployed landing just above the takeoff of the hypogastric artery. Acute 50 balloon was used to gently dilate the proximal and distal insertion sites and the junction finally arteriogram was obtained through pigtail catheter and this shows excellent positioning with no evidence of endoleak. The right femoral sheath was removed and the Perclose devices were deployed and gave excellent hemostasis. On the left sheath was withdrawn but the Perclose devices appear to pull through the wall of the artery. An additional Perclose was place with no hemostasis obtained. A 8 French sheath was replaced over the guidewire but there was an expanding hematoma in the left groin. For this reason I using local anesthesia an incision was made directly over the femoral artery. Initially controlled the bleeding from the artery with digital pressure. The Perclose is in fact had not been secured down to the level of the artery. I dissected the common femoral artery above and below  the sheath entry site and occluded these with peripheral DeBakey clamps. The: The artery from the sheath was closed with a 5-0 Prolene figure-of-eight suture. This gave excellent hemostasis. The wounds were irrigated with saline and  hemostasis cautery. Wound patient was given 50 mg of protamine to reverse the heparin. The wounds were closed with 2-0 Vicryl the subcutaneous tissue in 2 layers. The left groin and skin was closed with 3 or septic or Vicryl sutures. The right groin puncture was closed with a single 4-0 subcuticular Vicryl suture. The patient had palpable femoral pulses and was transferred to the recovery room stable condition   Rosetta Posner, M.D., [facility] 10/02/2016 3:05 PM

## 2016-10-02 NOTE — H&P (Signed)
Office Visit   08/27/2016 [facility] -Judge Stall, Arvilla Meres, MD  Vascular Surgery   AAA (abdominal aortic aneurysm) without rupture [facility])  Dx   AAA ; Referred by Ocie Doyne., MD  Reason for Visit   Additional Documentation   Vitals:   BP 109/69 (BP Location: Right Arm, Patient Position: Sitting, Cuff Size: Large)   Pulse  58   Temp 97 F (36.1 C) (Oral)   Resp 20   Ht 5\' 6"  (1.676 m)   Wt 194 lb (88 kg)   SpO2 95%   BMI 31.31 kg/m   BSA 2.02 m      More Vitals   Flowsheets:   Healthcare Directives,   Clinical Intake,   Infectious Disease Screening,   Custom Formula Data,   MEWS Score,   Anthropometrics,   Vital Signs     Encounter Info:   Billing Info,   History,   Allergies,   Detailed Report     All Notes   Progress Notes by Rosetta Posner, MD at 08/27/2016 3:30 PM   Author: Rosetta Posner, MD Author Type: Physician Filed: 08/27/2016 4:23 PM  Note Status: Signed Cosign: Cosign Not Required Encounter Date: 08/27/2016  Editor: Rosetta Posner, MD (Physician)                                          [facility]  Patient name: Jeff Lynch   MRN: [id number]        DOB: 12-30-1940        Sex: male  REASON FOR VISIT: Continued discussion of abdominal aortic aneurysm  HPI: Jeff Lynch is a 76 y.o. male here today for continued discussion. He has had a CT scan showing favorable anatomy for stent graft repair of his 6.5 cm infrarenal abdominal aortic aneurysm. He then underwent a cardiac evaluation. He had a positive stress test and therefore underwent cardiac catheterization. I spoke with Dr.Wallmeyer regarding this. He does have significant coronary disease but was felt to be too high risk for stenting. I felt that he should be best managed medically and that he would be a candidate for stent graft repair but not open aneurysm repair. Today the patient tells me that he has not had any chest pain. He does however  report to recent episodes of bright red blood per rectum. He does have some increased swelling in his legs and thought that his rectal bleeding may be different to change in some of his diuretic medication      Past Medical History:  Diagnosis Date  . Acute pain of right shoulder   . Cervical pain   . Chronic respiratory failure with hypoxia ([place])   . Cough   . Esophagitis, reflux   . Gout   . Hyperlipemia   . ILD (interstitial lung disease) ([place])    Likely Secondary to RA  . Impotence, organic   . Knee joint pain   . Prostate cancer ([place])   . Rheumatoid arthritis ([place])   . SOB (shortness of breath)   . Vitamin B12 deficiency   . Vitamin D deficiency          Family History  Problem Relation Age of Onset  . Liver disease Father   . Prostate cancer Brother   . Lung disease Neg Hx   .  Rheumatologic disease Neg Hx     SOCIAL HISTORY:      Social History  Substance Use Topics  . Smoking status: Former Smoker    Packs/day: 1.00    Years: 40.00    Types: Cigarettes    Quit date: 01/02/2002  . Smokeless tobacco: Never Used  . Alcohol use No    No Known Allergies        Current Outpatient Prescriptions  Medication Sig Dispense Refill  . acetaminophen (TYLENOL) 325 MG tablet Take 325 mg by mouth every 6 (six) hours as needed. Reported on 05/10/2015    . allopurinol (ZYLOPRIM) 100 MG tablet Take 1 tablet by mouth daily.    [name] aspirin 81 MG tablet Take 81 mg by mouth daily.    [name] atorvastatin (LIPITOR) 80 MG tablet Take 80 mg by mouth daily.    [name] CALCIUM PO Take by mouth 2 (two) times daily.    . Cyanocobalamin (VITAMIN B-12 PO) Take 1 tablet by mouth daily.    . furosemide (LASIX) 40 MG tablet Take 40 mg by mouth.    . mycophenolate (CELLCEPT) 500 MG tablet Take 1 tablet (500 mg total) by mouth 2 (two) times daily. 60 tablet 3  . omeprazole (PRILOSEC) 20 MG capsule Take 1 capsule (20 mg total) by mouth daily. (Patient taking  differently: Take 20 mg by mouth daily as needed. As needed) 30 capsule 6  . predniSONE (DELTASONE) 10 MG tablet TAKE 4 TABLETS EVERY DAY WITH BREAKFAST 240 tablet 1  . Vitamin D, Ergocalciferol, (DRISDOL) 50000 UNITS CAPS capsule Take 50,000 Units by mouth every 7 (seven) days. Reported on 08/07/2015     No current facility-administered medications for this visit.     REVIEW OF SYSTEMS:  [X]  denotes positive finding, [ ]  denotes negative finding Cardiac  Comments:  Chest pain or chest pressure:    Shortness of breath upon exertion:    Short of breath when lying flat:    Irregular heart rhythm:        Vascular    Pain in calf, thigh, or hip brought on by ambulation:    Pain in feet at night that wakes you up from your sleep:     Blood clot in your veins:    Leg swelling:  x         PHYSICAL EXAM:    Vitals:   08/27/16 1557  BP: 109/69  Pulse: (!) 58  Resp: 20  Temp: 97 F (36.1 C)  TempSrc: Oral  SpO2: 95%  Weight: 194 lb (88 kg)  Height: 5\' 6"  (1.676 m)    GENERAL: The patient is a well-nourished male, in no acute distress. The vital signs are documented above. CARDIOVASCULAR: Marked pitting edema in both lower extremities. Abdomen obese with no palpable aneurysm PULMONARY: There is good air exchange  MUSCULOSKELETAL: There are no major deformities or cyanosis. NEUROLOGIC: No focal weakness or paresthesias are detected. SKIN: There are no ulcers or rashes noted. PSYCHIATRIC: The patient has a normal affect.  DATA:  CT scan showing favorable anatomy for stent graft repair  MEDICAL ISSUES: Patient is planning for repair of his 6.5 cm abdominal aortic aneurysm. He is at significant risk due to pulmonary and cardiac status.  He now reports 2 episodes of bright red blood per act him. I have recommended that he discuss this with Dr. Micheal Likens and have it GI evaluation prior to proceeding. Once this is been evaluated we will schedule him  for stent graft  repair.    Rosetta Posner, MD FACS [facility] [phone] Pager [phone]     Instructions   Addendum:  The patient has been re-examined and re-evaluated.  The patient's history and physical has been reviewed and is unchanged.    Jeff Lynch is a 76 y.o. male is being admitted with Abdominal Aortic Aneurysm  I71.4. All the risks, benefits and other treatment options have been discussed with the patient. The patient has consented to proceed with Procedure(s): ABDOMINAL AORTIC ENDOVASCULAR STENT GRAFT/GORE as a surgical intervention.  Curt Jews 10/02/2016 10:47 AM [facility]

## 2016-10-02 NOTE — Progress Notes (Signed)
  [facility] Day of Surgery Note  Patient seen in PACU. Throat is sore.   Vitals:   10/02/16 1521 10/02/16 1536  BP: 112/63 113/66  Pulse: (!) 55 (!) 49  Resp: 20 20  Temp:      Left groin is soft with moderate fullness. Incision clean and intact.  Abdomen soft and non tender Right groin without hematoma. Brisk DP and PT signals bilaterally.   Assessment/Plan:  This is a 76 y.o. male who is s/p EVAR, left groin cutdown  Requiring a little bit of neo currently in PACU.  Left groin hematoma stable.  To 4E when weaned off neo.    Virgina Jock, [place] Pager: [phone] 10/02/2016 4:02 PM

## 2016-10-02 NOTE — Consult Note (Signed)
Urology Consult  Referring physician: T Early Reason for referral: unable to insert catheter  Chief Complaint: Unable to insert catheter  History of Present Illness: Vascular surgery; sedated in OR; cannot insert catheter; no other history  Modifying factors: There are no other modifying factors  Associated signs and symptoms: There are no other associated signs and symptoms Aggravating and relieving factors: There are no other aggravating or relieving factors Severity: Moderate Duration: Persistent  Past Medical History:  Diagnosis Date  . Acute pain of right shoulder   . Aortic stenosis    mild-moderate AS 07/2016   . Cervical pain   . Chronic respiratory failure with hypoxia ([place])   . Coronary artery disease   . Cough   . Dyspnea   . Esophagitis, reflux   . GERD (gastroesophageal reflux disease)    ocassionally  . Gout   . Hyperlipemia   . ILD (interstitial lung disease) ([place])    Likely Secondary to RA  . Impotence, organic   . Knee joint pain   . Prostate cancer ([place])   . Rheumatoid arthritis ([place])   . SOB (shortness of breath)   . Vitamin B12 deficiency   . Vitamin D deficiency    Past Surgical History:  Procedure Laterality Date  . BACK SURGERY  1990  . CARDIAC CATHETERIZATION     07/19/16 [facility]: 30% LM, 90% pLAD, 30% OM1, 60% mCx, 30% OM2, 50% pRCA. EF 75%. Medical tx. Consider PCI if failed med tx. Not a CABG candidate.  [name] HERNIA REPAIR     1960's  . PROSTATECTOMY  2003    Medications: I have reviewed the patient's current medications. Allergies: No Known Allergies  Family History  Problem Relation Age of Onset  . Liver disease Father   . Prostate cancer Brother   . Lung disease Neg Hx   . Rheumatologic disease Neg Hx    Social History:  reports that he quit smoking about 14 years ago. His smoking use included Cigarettes. He has a 40.00 pack-year smoking history. He has never used smokeless tobacco. He reports that he does not drink  alcohol or use drugs.  ROS: All systems are reviewed and negative except as noted. Rest negative  Physical Exam:  Vital signs in last 24 hours: Temp:  [98 F (36.7 C)] 98 F (36.7 C) (08/01 0957) Pulse Rate:  [70] 70 (08/01 0957) Resp:  [20] 20 (08/01 0957) BP: (127)/(59) 127/59 (08/01 0957) SpO2:  [93 %] 93 % (08/01 0957)  Cardiovascular: Skin warm; not flushed Respiratory: Breaths quiet; no shortness of breath Abdomen: No masses Neurological: Normal sensation to touch Musculoskeletal: Normal motor function arms and legs Lymphatics: No inguinal adenopathy Skin: No rashes Genitourinary:non distended with normal penis  Laboratory Data:  No results found for this or any previous visit (from the past 72 hour(s)). Recent Results (from the past 240 hour(s))  Surgical pcr screen     Status: None   Collection Time: 09/27/16  9:05 AM  Result Value Ref Range Status   MRSA, PCR NEGATIVE NEGATIVE Final   Staphylococcus aureus NEGATIVE NEGATIVE Final    Comment:        The Xpert SA Assay (FDA approved for NASAL specimens in patients over 58 years of age), is one component of a comprehensive surveillance program.  Test performance has been validated by [facility] for patients greater than or equal to 68 year old. It is not intended to diagnose infection nor to guide or monitor treatment.  Creatinine:  Recent Labs  09/27/16 0903  CREATININE 1.26*    Xrays: See report/chart none  Impression/Assessment:  Stricture  Plan:  Cystoscopy- 6 Fr short dense stricture near membranous urethra Wire passed cysto Open ended passed with urine return Wire repassed but balloon would not surpass dense sticture Filiform passed and checked with scope - easy Dilated 2x to 10 fr from 8 Fr- scar bit rigid Rescoped and stricture much larger - wire passed and ballooned for 3 minutes to 24 Fr Great urine return 18 fr council easily passed  Clear urine On keflex  Will  follow  Melvern Ramone A 10/02/2016, 12:44 PM

## 2016-10-02 NOTE — Transfer of Care (Signed)
Immediate Anesthesia Transfer of Care Note  Patient: Jeff Lynch  Procedure(s) Performed: Procedure(s): ABDOMINAL AORTIC ENDOVASCULAR STENT GRAFT/GORE (N/A) CYSTOSCOPY (N/A)  Patient Location: PACU  Anesthesia Type:MAC  Level of Consciousness: sedated  Airway & Oxygen Therapy: Patient Spontanous Breathing and Patient connected to face mask oxygen  Post-op Assessment: Report given to RN and Post -op Vital signs reviewed and stable  Post vital signs: Reviewed and stable  Last Vitals:  Vitals:   10/02/16 0957  BP: (!) 127/59  Pulse: 70  Resp: 20  Temp: 36.7 C    Last Pain:  Vitals:   10/02/16 0957  TempSrc: Oral         Complications: No apparent anesthesia complications

## 2016-10-02 NOTE — Progress Notes (Signed)
Was unable to place a foley in the OR with RN staff. Dr. Donnetta Hutching was notified and came to assess the patient. Dr. Donnetta Hutching tried to insert a coude but was unsuccessful. Urology was consulted and Dr. Nicki Reaper MacDiarmid came and inserted a foley successfully.

## 2016-10-02 NOTE — Anesthesia Procedure Notes (Signed)
Procedure Name: MAC Date/Time: 10/02/2016 11:15 AM Performed by: Merrilyn Puma B Pre-anesthesia Checklist: Patient identified, Emergency Drugs available, Suction available, Patient being monitored and Timeout performed Patient Re-evaluated:Patient Re-evaluated prior to induction Oxygen Delivery Method: Simple face mask Induction Type: IV induction Placement Confirmation: positive ETCO2 and breath sounds checked- equal and bilateral Dental Injury: Teeth and Oropharynx as per pre-operative assessment

## 2016-10-02 NOTE — Progress Notes (Signed)
Patient arrived to PACU with large hematoma to left groin.  Site was marked and Dr. Donnetta Hutching notified. He saw patient at bedside and is aware. No new orders. Will continue to monitor.

## 2016-10-02 NOTE — Progress Notes (Signed)
Patient received from PACU into 4E21. ART line monitoring resumed at bedside. VSS. Stepdaughter [name] at bedside. Patient no report of pain, AOx4. R Femoral Level 0, L femoral level 2 with hematoma which patient reports as sore is marked to monitor for progression.   Night Shift RN notified that suction canister is missing small tubing connector and it has been requested to be restocked by NS.

## 2016-10-03 ENCOUNTER — Encounter (HOSPITAL_COMMUNITY): Payer: Self-pay | Admitting: Vascular Surgery

## 2016-10-03 LAB — TYPE AND SCREEN
ABO/RH(D): O POS
Antibody Screen: NEGATIVE
UNIT DIVISION: 0
Unit division: 0

## 2016-10-03 LAB — CBC
HEMATOCRIT: 33.1 % — AB (ref 39.0–52.0)
Hemoglobin: 10.7 g/dL — ABNORMAL LOW (ref 13.0–17.0)
MCH: 29.6 pg (ref 26.0–34.0)
MCHC: 32.3 g/dL (ref 30.0–36.0)
MCV: 91.7 fL (ref 78.0–100.0)
Platelets: 152 10*3/uL (ref 150–400)
RBC: 3.61 MIL/uL — ABNORMAL LOW (ref 4.22–5.81)
RDW: 13.9 % (ref 11.5–15.5)
WBC: 12.8 10*3/uL — AB (ref 4.0–10.5)

## 2016-10-03 LAB — BPAM RBC
BLOOD PRODUCT EXPIRATION DATE: 201808292359
Blood Product Expiration Date: 201808292359
UNIT TYPE AND RH: 5100
Unit Type and Rh: 5100

## 2016-10-03 LAB — BASIC METABOLIC PANEL
ANION GAP: 5 (ref 5–15)
BUN: 12 mg/dL (ref 6–20)
CALCIUM: 8.3 mg/dL — AB (ref 8.9–10.3)
CO2: 27 mmol/L (ref 22–32)
Chloride: 108 mmol/L (ref 101–111)
Creatinine, Ser: 1.07 mg/dL (ref 0.61–1.24)
GFR calc Af Amer: >60 mL/min (ref >60)
GFR calc non Af Amer: >60 mL/min (ref >60)
GLUCOSE: 129 mg/dL — AB (ref 65–99)
POTASSIUM: 4 mmol/L (ref 3.5–5.1)
Sodium: 140 mmol/L (ref 135–145)

## 2016-10-03 MED ORDER — MYCOPHENOLATE MOFETIL 250 MG PO CAPS
500.0000 mg | ORAL_CAPSULE | Freq: Two times a day (BID) | ORAL | Status: DC
  Administered 2016-10-03 – 2016-10-04 (×3): 500 mg via ORAL
  Filled 2016-10-03 (×3): qty 2

## 2016-10-03 NOTE — Progress Notes (Addendum)
Patient ambulated twice in the hall with this RN on 4L O2, using a front wheel walker, pt maintained an oxygen saturation greater than 90% during ambulation, ambulation well tolerated the second time, will continue to monitor.

## 2016-10-03 NOTE — Anesthesia Postprocedure Evaluation (Signed)
Anesthesia Post Note  Patient: Jeff Lynch  Procedure(s) Performed: Procedure(s) (LRB): ABDOMINAL AORTIC ENDOVASCULAR STENT GRAFT/GORE (N/A) CYSTOSCOPY (N/A)     Patient location during evaluation: PACU Anesthesia Type: MAC Level of consciousness: awake and alert Pain management: pain level controlled Vital Signs Assessment: post-procedure vital signs reviewed and stable Respiratory status: spontaneous breathing, nonlabored ventilation, respiratory function stable and patient connected to nasal cannula oxygen Cardiovascular status: stable and blood pressure returned to baseline Anesthetic complications: no    Last Vitals:  Vitals:   10/03/16 0402 10/03/16 0800  BP: 105/70 129/62  Pulse: 72 (!) 57  Resp: (!) 25 12  Temp: 37 C 36.9 C    Last Pain:  Vitals:   10/03/16 0800  TempSrc: Oral  PainSc:                  [name]

## 2016-10-03 NOTE — Progress Notes (Addendum)
  Progress Note  SUBJECTIVE:    POD #1  Left groin sore. Wants to go home tomorrow.   OBJECTIVE:   Vitals:   10/03/16 0019 10/03/16 0402  BP: 95/66 105/70  Pulse: 69 72  Resp: (!) 21 (!) 25  Temp: 97.8 F (36.6 C) 98.6 F (37 C)    Intake/Output Summary (Last 24 hours) at 10/03/16 0723 Last data filed at 10/03/16 0400  Gross per 24 hour  Intake          4056.25 ml  Output             1300 ml  Net          2756.25 ml   Abdomen soft and non tender Left groin with ecchymosis. Left groin is soft without pulsatility.  Right groin without hematoma. Feet warm bilaterally.   ASSESSMENT/PLAN:   76 y.o. male is s/p: EVAR, left groin cutdown 1 Day Post-Op   Stable this am.  Left groin ecchymosis but no evidence of active bleeding or pseudoaneurysm.  On home 02.  Urethral stricture: Keep foley for now. Appreciate urology assistance.  Mobilize.  Patient feels like he needs another day here. Plan d/c home in am.   Jeff Lynch 10/03/2016 7:23 AM -- LABS:   CBC    Component Value Date/Time   WBC 12.8 (H) 10/03/2016 0225   HGB 10.7 (L) 10/03/2016 0225   HCT 33.1 (L) 10/03/2016 0225   PLT 152 10/03/2016 0225    BMET    Component Value Date/Time   NA 140 10/03/2016 0225   K 4.0 10/03/2016 0225   CL 108 10/03/2016 0225   CO2 27 10/03/2016 0225   GLUCOSE 129 (H) 10/03/2016 0225   BUN 12 10/03/2016 0225   CREATININE 1.07 10/03/2016 0225   CALCIUM 8.3 (L) 10/03/2016 0225   GFRNONAA >60 10/03/2016 0225   GFRAA >60 10/03/2016 0225    COAG Lab Results  Component Value Date   INR 1.13 10/02/2016   INR 1.02 09/27/2016   No results found for: PTT  ANTIBIOTICS:   Anti-infectives    Start     Dose/Rate Route Frequency Ordered Stop   10/02/16 1915  cefUROXime (ZINACEF) 1.5 g in dextrose 5 % 50 mL IVPB     1.5 g 100 mL/hr over 30 Minutes Intravenous Every 12 hours 10/02/16 1907 10/03/16 0657   10/02/16 0913  cefUROXime (ZINACEF) 1.5 g in dextrose 5 % 50 mL  IVPB     1.5 g 100 mL/hr over 30 Minutes Intravenous 30 min pre-op 10/02/16 0913 10/02/16 [place], PA-C [facility] Office: [phone] Pager: [phone] 10/03/2016 7:23 AM  I have examined the patient, reviewed and agree with above. Stable overall. We will keep Foley catheter in until outpatient evaluation with [facility]. Plan discharge tomorrow if he does well mobilizing today. Left groin incision healing with bruising but no hematoma.  Curt Jews, MD 10/03/2016 9:23 AM

## 2016-10-03 NOTE — Consult Note (Signed)
[facility] CM Primary Care Navigator  10/03/2016  Jeff Lynch Mar 29, 1940 [id number]   Went to see patient at the bedside to identify possibledischarge needs. Met with daughter Jeff Lynch) and granddaughter as well. Patient reports having scheduled for repair of abdominal aortic aneurysmthat led to this admission/ surgery. Patient endorses Dr. Wende Lynch with [place] [facility] as hisprimary care provider.   Patient reportsusing [place] at [employer] and [employer] delivery serviceto obtain his medications without problem at present.  Hemanages his medications at home using"pill box" system filled weekly.  Patient was driving prior to admission/ surgery. Patient's daughter Jeff Lynch- who lives with him) will be providing transportation to hisdoctors' appointments after discharge.  [employer] transportation benefit discussed with patient and family and they expressed gratitude for information provided.   Patient lives with daughter and other family members who can provide assistance with his needs at home as stated.  Anticipated discharge plan ishome according to patient.  Patient and family voiced understanding to call primary care provider's office when he returns home, for a post discharge follow-up appointment within a week or sooner if needs arise.Patient letter (with PCP's contact number) wasprovided as areminder.  Explained to patient and family regarding [facility] CM services available for further healthmanagement but patient denies any further concerns or needs at this time.  However, he opted and verbally agreed to [place] to followup with his recovery at home.  Referral to [facility] calls made.   [facility] care management information provided for any future needs that he may have.   For questions, please contact:  Jeff Lynch, BSN, RN- [facility] Primary Care Navigator  Telephone: [phone] [place]

## 2016-10-04 MED ORDER — OXYCODONE-ACETAMINOPHEN 5-325 MG PO TABS: 1.0000 | ORAL_TABLET | ORAL | 0 refills | Status: DC | PRN

## 2016-10-04 NOTE — Discharge Summary (Signed)
[facility] Discharge Summary  Jeff Lynch 1940-05-09 76 y.o. male  [id number]  Admission Date: 10/02/2016  Discharge Date: 10/04/2016  Physician: Rosetta Posner, MD  Admission Diagnosis: Abdominal Aortic Aneurysm  I71.4  HPI:   This is a 76 y.o. male who has had a CT scan showing favorable anatomy for stent graft repair of his 6.5 cm infrarenal abdominal aortic aneurysm. He then underwent a cardiac evaluation. He had a positive stress test and therefore underwent cardiac catheterization. Dr Early spoke with Dr.Wallmeyerregarding this. He does have significant coronary disease but was felt to be too high risk for stenting. I felt that he should be best managed medically and that he would be a candidate for stent graft repair but not open aneurysm repair. Today the patient tells me that he has not had any chest pain. He does however report to recent episodes of bright red blood per rectum. He does have some increased swelling in his legs and thought that his rectal bleeding may be different to change in some of his diuretic medication  Hospital Course:  The patient was admitted to the hospital and taken to the operating room on 10/02/2016 and underwent: Gore stent graft repair of abdominal aortic aneurysm. There was difficulty with foley catheter insertion prior to surgery and urology was consulted for assistance. He underwent cystoscopy and balloon dilation and a foley catheter was placed. A perclose was unsuccessful in the left groin and he required left groin cutdown.   The patient tolerated the procedure well and was transported to the PACU in stable condition.   By POD 1, the patient was doing well. His left groin had ecchymosis and mild fullness but no pseudoaneurysm detectable. His right groin was without hematoma. His feet were warm and well perfused bilaterally. He wanted an additional day to work with mobility. His foley was kept in.  By POD 2, the patient  felt ready for discharged. His left groin was soft and ecchymosis stable. No hematoma present left groin. Urology follow-up established. He was discharged home with foley in good condition.     CBC    Component Value Date/Time   WBC 12.8 (H) 10/03/2016 0225   RBC 3.61 (L) 10/03/2016 0225   HGB 10.7 (L) 10/03/2016 0225   HCT 33.1 (L) 10/03/2016 0225   PLT 152 10/03/2016 0225   MCV 91.7 10/03/2016 0225   MCH 29.6 10/03/2016 0225   MCHC 32.3 10/03/2016 0225   RDW 13.9 10/03/2016 0225   LYMPHSABS 2.4 09/06/2016 1040   MONOABS 1.1 (H) 09/06/2016 1040   EOSABS 0.2 09/06/2016 1040   BASOSABS 0.0 09/06/2016 1040    BMET    Component Value Date/Time   NA 140 10/03/2016 0225   K 4.0 10/03/2016 0225   CL 108 10/03/2016 0225   CO2 27 10/03/2016 0225   GLUCOSE 129 (H) 10/03/2016 0225   BUN 12 10/03/2016 0225   CREATININE 1.07 10/03/2016 0225   CALCIUM 8.3 (L) 10/03/2016 0225   GFRNONAA >60 10/03/2016 0225   GFRAA >60 10/03/2016 0225     Discharge Instructions:   The patient is discharged to home with extensive instructions on wound care and progressive ambulation.  They are instructed not to drive or perform any heavy lifting until returning to see the physician in his office.  Discharge Instructions    ABDOMINAL PROCEDURE/ANEURYSM REPAIR/AORTO-BIFEMORAL BYPASS:  Call MD for increased abdominal pain; cramping diarrhea; nausea/vomiting    Complete by:  As directed  Call MD for:  redness, tenderness, or signs of infection (pain, swelling, bleeding, redness, odor or green/yellow discharge around incision site)    Complete by:  As directed    Call MD for:  severe or increased pain, loss or decreased feeling  in affected limb(s)    Complete by:  As directed    Call MD for:  temperature >100.5    Complete by:  As directed    Discharge wound care:    Complete by:  As directed    Wash the left groin wound with soap and water daily and pat dry. (No tub bath-only shower)  Then put a  dry gauze or washcloth there to keep this area dry daily and as needed.  Do not use Vaseline or neosporin on your incisions.  Only use soap and water on your incisions and then protect and keep dry.   Driving Restrictions    Complete by:  As directed    No driving for 2 weeks and while on pain medication   Increase activity slowly    Complete by:  As directed    Walk with assistance use walker or cane as needed   Lifting restrictions    Complete by:  As directed    No lifting for 4 weeks   Resume previous diet    Complete by:  As directed       Discharge Diagnosis:  Abdominal Aortic Aneurysm  I71.4  Secondary Diagnosis: Patient Active Problem List   Diagnosis Date Noted  . Abdominal aortic aneurysm without rupture ([place]) 10/02/2016  . Aortic valve stenosis 08/07/2015  . Rheumatoid arthritis ([place]) 01/31/2014  . Postinflammatory pulmonary fibrosis with autoimmune features  01/26/2014  . Chronic respiratory failure with hypoxia ([place]) 01/26/2014  . Vitamin D deficiency   . Hyperlipemia   . Esophagitis, reflux   . Knee joint pain    Past Medical History:  Diagnosis Date  . Acute pain of right shoulder   . Aortic stenosis    mild-moderate AS 07/2016   . Cervical pain   . Chronic respiratory failure with hypoxia ([place])   . Coronary artery disease   . Cough   . Dyspnea   . Esophagitis, reflux   . GERD (gastroesophageal reflux disease)    ocassionally  . Gout   . Hyperlipemia   . ILD (interstitial lung disease) ([place])    Likely Secondary to RA  . Impotence, organic   . Knee joint pain   . Prostate cancer ([place])   . Rheumatoid arthritis ([place])   . SOB (shortness of breath)   . Vitamin B12 deficiency   . Vitamin D deficiency      Allergies as of 10/04/2016   No Known Allergies     Medication List    TAKE these medications   acetaminophen 325 MG tablet Commonly known as:  TYLENOL Take 325 mg by mouth every 6 (six) hours as needed. Reported on 05/10/2015   allopurinol  100 MG tablet Commonly known as:  ZYLOPRIM Take 1 tablet by mouth daily.   aspirin 81 MG tablet Take 81 mg by mouth daily.   atorvastatin 80 MG tablet Commonly known as:  LIPITOR Take 80 mg by mouth daily.   CALCIUM PO Take 1 tablet by mouth 2 (two) times daily.   LASIX 40 MG tablet Generic drug:  furosemide Take 40 mg by mouth daily.   metoprolol succinate 25 MG 24 hr tablet Commonly known as:  TOPROL-XL Take 2 tablets by mouth  daily.   mycophenolate 500 MG tablet Commonly known as:  CELLCEPT Take 2 tablets (1,000 mg total) by mouth 2 (two) times daily.   nitroGLYCERIN 0.4 MG SL tablet Commonly known as:  NITROSTAT Place 0.4 mg under the tongue every 5 (five) minutes as needed for chest pain.   omeprazole 20 MG capsule Commonly known as:  PRILOSEC Take 1 capsule (20 mg total) by mouth daily. What changed:  when to take this  reasons to take this  additional instructions   oxyCODONE-acetaminophen 5-325 MG tablet Commonly known as:  PERCOCET/ROXICET Take 1-2 tablets by mouth every 4 (four) hours as needed for moderate pain.   potassium chloride SA 20 MEQ tablet Commonly known as:  K-DUR,KLOR-CON Take 20 mEq by mouth daily.   predniSONE 10 MG tablet Commonly known as:  DELTASONE Take 30 mg by mouth daily.   vitamin B-12 1000 MCG tablet Commonly known as:  CYANOCOBALAMIN Take 1 tablet by mouth daily.   Vitamin D (Ergocalciferol) 50000 units Caps capsule Commonly known as:  DRISDOL Take 50,000 Units by mouth every 7 (seven) days. Reported on 08/07/2015       Percocet #20 No Refill  Disposition: Home  Patient's condition: is Good  Follow up: 1. Dr. Donnetta Hutching in 4 weeks with CTA   Virgina Jock, PA-C [facility] [phone] 10/04/2016  12:22 PM   - For VQI Registry use --- Instructions: Press F2 to tab through selections.  Delete question if not applicable.   Post-op:  Time to Extubation: patient not intubated [ ]  In OR, [ ]  <  12 hrs, [ ]  12-24 hrs, [ ]  >=24 hrs Vasopressors Req. Post-op: No MI: No., [ ]  Troponin only, [ ]  EKG or Clinical New Arrhythmia: No CHF: No ICU Stay: 0 days Transfusion: No     Complications: Resp failure: No., [ ]  Pneumonia, [ ]  Ventilator Chg in renal function: No., [ ]  Inc. Cr > 0.5, [ ]  Temp. Dialysis, [ ]  Permanent dialysis Leg ischemia: No., no Surgery needed, [ ]  Yes, Surgery needed, [ ]  Amputation Bowel ischemia: No., [ ]  Medical Rx, [ ]  Surgical Rx Wound complication: No., [ ]  Superficial separation/infection, [ ]  Return to OR Return to OR: No  Return to OR for bleeding: No Stroke: No., [ ]  Minor, [ ]  Major  Discharge medications: Statin use:  Yes If No: [ ]  For Medical reasons, [ ]  Non-compliant, [ ]  Not-indicated ASA use:  Yes  If No: [ ]  For Medical reasons, [ ]  Non-compliant, [ ]  Not-indicated Plavix use:  No If No: [ ]  For Medical reasons, [ ]  Non-compliant, [x ] Not-indicated Beta blocker use:  Yes If No: [ ]  For Medical reasons, [ ]  Non-compliant, [ ]  Not-indicated

## 2016-10-04 NOTE — Progress Notes (Signed)
  Progress Note  SUBJECTIVE:    POD #2  Left groin still sore. Ready to go home.   OBJECTIVE:   Vitals:   10/04/16 0000 10/04/16 0400  BP: 126/64 108/60  Pulse: 69 (!) 53  Resp: 16 20  Temp:      Intake/Output Summary (Last 24 hours) at 10/04/16 0723 Last data filed at 10/04/16 0548  Gross per 24 hour  Intake              855 ml  Output             5775 ml  Net            -4920 ml   Abdomen soft and non tender. Left groin soft with extensive ecchymosis. Incision clean and intact. Right groin with mild ecchymosis. No hematoma. Feet warm bilaterally. ASSESSMENT/PLAN:   76 y.o. male is s/p: EVAR 2 Days Post-Op   Ambulated well yesterday.  Left groin hematoma stable. Urethral stricture: Keep foley in at d/c. Will set up appointment with urology for f/u. D/c home today. F/u in 4 weeks with Dr. Donnetta Hutching and CTA abd/pelv.  Alvia Grove 10/04/2016 7:23 AM -- LABS:   CBC    Component Value Date/Time   WBC 12.8 (H) 10/03/2016 0225   HGB 10.7 (L) 10/03/2016 0225   HCT 33.1 (L) 10/03/2016 0225   PLT 152 10/03/2016 0225    BMET    Component Value Date/Time   NA 140 10/03/2016 0225   K 4.0 10/03/2016 0225   CL 108 10/03/2016 0225   CO2 27 10/03/2016 0225   GLUCOSE 129 (H) 10/03/2016 0225   BUN 12 10/03/2016 0225   CREATININE 1.07 10/03/2016 0225   CALCIUM 8.3 (L) 10/03/2016 0225   GFRNONAA >60 10/03/2016 0225   GFRAA >60 10/03/2016 0225    COAG Lab Results  Component Value Date   INR 1.13 10/02/2016   INR 1.02 09/27/2016   No results found for: PTT  ANTIBIOTICS:   Anti-infectives    Start     Dose/Rate Route Frequency Ordered Stop   10/02/16 1915  cefUROXime (ZINACEF) 1.5 g in dextrose 5 % 50 mL IVPB     1.5 g 100 mL/hr over 30 Minutes Intravenous Every 12 hours 10/02/16 1907 10/03/16 0657   10/02/16 0913  cefUROXime (ZINACEF) 1.5 g in dextrose 5 % 50 mL IVPB     1.5 g 100 mL/hr over 30 Minutes Intravenous 30 min pre-op 10/02/16 0913 10/02/16  [place], PA-C [facility] Office: [phone] Pager: [phone] 10/04/2016 7:23 AM

## 2016-10-04 NOTE — Care Management Note (Signed)
Case Management Note Marvetta Gibbons RN, BSN Unit 4E-Case Manager [phone]  Patient Details  Name: Jeff Lynch MRN: [id number] Date of Birth: 04-25-1940  Subjective/Objective:    Pt admitted s/p EVAR               Action/Plan: PTA pt lived at home, plan to d/c back home- no CM needs noted for discharge.   Expected Discharge Date:  10/04/16               Expected Discharge Plan:  Home/Self Care  In-House Referral:  NA  Discharge planning Services  CM Consult  Post Acute Care Choice:  NA Choice offered to:  NA  DME Arranged:    DME Agency:     HH Arranged:    HH Agency:     Status of Service:  Completed, signed off  If discussed at [place] of Stay Meetings, dates discussed:    Discharge Disposition: home/self care   Additional Comments:  Dawayne Patricia, RN 10/04/2016, 10:14 AM

## 2016-10-04 NOTE — Discharge Instructions (Signed)
Keep foley catheter in until appointment with urology (Dr. Matilde Sprang) on 10/08/16 at 9:00 am. [facility] [place], [place]     [facility]   Discharge Instructions  Endovascular Aortic Aneurysm Repair  Please refer to the following instructions for your post-procedure care. Your surgeon or Physician Assistant will discuss any changes with you.  Activity  You are encouraged to walk as much as you can. You can slowly return to normal activities but must avoid strenuous activity and heavy lifting until your doctor tells you it's OK. Avoid activities such as vacuuming or swinging a gold club. It is normal to feel tired for several weeks after your surgery. Do not drive until your doctor gives the OK and you are no longer taking prescription pain medications. It is also normal to have difficulty with sleep habits, eating, and bowel movements after surgery. These will go away with time.  Bathing/Showering  You may shower after you go home. If you have an incision, do not soak in a bathtub, hot tub, or swim until the incision heals completely.  If you have incisions in your groin, sash the groin wounds with soap and water daily and pat dry. (No tub bath-only shower)  Then put a dry gauze or washcloth there to keep this area dry to help prevent wound infection daily and as needed.  Do not use Vaseline or neosporin on your incisions.  Only use soap and water on your incisions and then protect and keep dry.  Incision Care  Shower every day. Clean your incision with mild soap and water. Pat the area dry with a clean towel. You do not need a bandage unless otherwise instructed. Do not apply any ointments or creams to your incision. If you clothing is irritating, you may cover your incision with a dry gauze pad.  Diet  Resume your normal diet. There are no special food restrictions following this procedure. A low fat/low cholesterol diet is  recommended for all patients with vascular disease. In order to heal from your surgery, it is CRITICAL to get adequate nutrition. Your body requires vitamins, minerals, and protein. Vegetables are the best source of vitamins and minerals. Vegetables also provide the perfect balance of protein. Processed food has little nutritional value, so try to avoid this.  Medications  Resume taking all of your medications unless your doctor or nurse practitioner tells you not to. If your incision is causing pain, you may take over-the-counter pain relievers such as acetaminophen (Tylenol). If you were prescribed a stronger pain medication, please be aware these medications can cause nausea and constipation. Prevent nausea by taking the medication with a snack or meal. Avoid constipation by drinking plenty of fluids and eating foods with a high amount of fiber, such as fruits, vegetables, and grains. Do not take Tylenol if you are taking prescription pain medications.   Follow up  [place] office will schedule a follow-up appointment with a C.T. scan 3-4 weeks after your surgery.  Please call us immediately for any of the following conditions  Severe or worsening pain in your legs or feet or in your abdomen back or chest. Increased pain, redness, drainage (pus) from your incision sit. Increased abdominal pain, bloating, nausea, vomiting or persistent diarrhea. Fever of 101 degrees or higher. Swelling in your leg (s),  Reduce your risk of vascular disease  Stop smoking. If you would like help call [facility] at 1-800-QUIT-NOW 940-810-9601) or [place] at (917)719-8395. Manage  your cholesterol Maintain a desired weight Control your diabetes Keep your blood pressure down  If you have questions, please call the office at [phone].

## 2016-10-04 NOTE — Progress Notes (Signed)
Patient in a stable condition, discharge education reviewed with patient and his step daughter at bedside, they verbalized understanding, paper prescription given to patient,iv removed , tele dc ccmd notified patient belongings at bedside, patients step daughter to transport patient home.

## 2016-10-07 ENCOUNTER — Other Ambulatory Visit: Payer: Self-pay

## 2016-10-07 NOTE — Op Note (Signed)
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

## 2016-10-07 NOTE — Patient Outreach (Signed)
[facility]) Care Management  10/07/2016  Jeff Lynch 04-26-1940 [id number]     EMMI-GENERAL DISCHARGE RED ON EMMI ALERT Day # 1 Date: 10/05/16 Red Alert Reason: " Scheduled follow up appt? No"   Outreach attempt # 1 to patient.  Spoke with patient. States he is doing well since discharge home. Reviewed and addressed red alert with patient. He is able to voice that he has f/u urology appt on tomorrow, PCP f/u appt next week(as PCP out of the office this week) and f/u appt with Dr. Donnetta Hutching in Sept. He has transportation to these appts and is aware that he is not supposed to drive at this time. He has all his meds and denies any questions/concerns regarding them. No further RN CM needs or concerns at this time. Advised patient that they would continue to get automated EMMI-GENERAL post discharge calls to assess how they are doing following recent hospitalization and will receive a call from a nurse if any of their responses were abnormal. Patient voiced understanding and was appreciative of f/u call.      Plan: RN CM will notify [facility] administrative assistant of case status.   Enzo Montgomery, RN,BSN,CCM [place] Management Telephonic Care Management Coordinator Direct Phone: [phone] Toll Free: [phone] Fax: [phone]

## 2016-10-08 ENCOUNTER — Telehealth: Payer: Self-pay | Admitting: Vascular Surgery

## 2016-10-08 NOTE — Telephone Encounter (Signed)
-----   Message from Mena Goes, RN sent at 10/03/2016 10:39 AM EDT ----- Regarding: 4 weeks w/ CTA   ----- Message ----- From: Alvia Grove, PA-C Sent: 10/03/2016   9:27 AM To: Vvs Charge Pool  S/p EVAR 10/03/16  F/u with Dr. Donnetta Hutching in 4 weeks with CTA abd/pelv.  Thanks Maudie Mercury

## 2016-10-08 NOTE — Telephone Encounter (Signed)
Sched CTA  11/11/16 at 9:30 at [place]. Sched MD 11/20/16 at 10:30. Lm on hm#.

## 2016-10-26 DIAGNOSIS — R0602 Shortness of breath: Secondary | ICD-10-CM

## 2016-10-26 DIAGNOSIS — A419 Sepsis, unspecified organism: Secondary | ICD-10-CM | POA: Diagnosis not present

## 2016-10-26 DIAGNOSIS — R079 Chest pain, unspecified: Secondary | ICD-10-CM

## 2016-10-26 DIAGNOSIS — N390 Urinary tract infection, site not specified: Secondary | ICD-10-CM

## 2016-10-26 DIAGNOSIS — E2740 Unspecified adrenocortical insufficiency: Secondary | ICD-10-CM | POA: Diagnosis not present

## 2016-10-27 DIAGNOSIS — R079 Chest pain, unspecified: Secondary | ICD-10-CM

## 2016-10-27 DIAGNOSIS — A419 Sepsis, unspecified organism: Secondary | ICD-10-CM | POA: Diagnosis not present

## 2016-10-27 DIAGNOSIS — R0602 Shortness of breath: Secondary | ICD-10-CM

## 2016-10-27 DIAGNOSIS — E2740 Unspecified adrenocortical insufficiency: Secondary | ICD-10-CM | POA: Diagnosis not present

## 2016-10-27 DIAGNOSIS — N390 Urinary tract infection, site not specified: Secondary | ICD-10-CM | POA: Diagnosis not present

## 2016-10-28 DIAGNOSIS — A419 Sepsis, unspecified organism: Secondary | ICD-10-CM | POA: Diagnosis not present

## 2016-10-28 DIAGNOSIS — R079 Chest pain, unspecified: Secondary | ICD-10-CM | POA: Diagnosis not present

## 2016-10-28 DIAGNOSIS — N390 Urinary tract infection, site not specified: Secondary | ICD-10-CM | POA: Diagnosis not present

## 2016-10-28 DIAGNOSIS — E2740 Unspecified adrenocortical insufficiency: Secondary | ICD-10-CM | POA: Diagnosis not present

## 2016-10-29 ENCOUNTER — Telehealth: Payer: Self-pay | Admitting: Pulmonary Disease

## 2016-10-29 DIAGNOSIS — A419 Sepsis, unspecified organism: Secondary | ICD-10-CM | POA: Diagnosis not present

## 2016-10-29 DIAGNOSIS — N390 Urinary tract infection, site not specified: Secondary | ICD-10-CM | POA: Diagnosis not present

## 2016-10-29 DIAGNOSIS — R079 Chest pain, unspecified: Secondary | ICD-10-CM | POA: Diagnosis not present

## 2016-10-29 DIAGNOSIS — J8410 Pulmonary fibrosis, unspecified: Secondary | ICD-10-CM

## 2016-10-29 DIAGNOSIS — E2740 Unspecified adrenocortical insufficiency: Secondary | ICD-10-CM | POA: Diagnosis not present

## 2016-10-29 NOTE — Telephone Encounter (Signed)
LABS 09/27/16 CBC: 13.0/14.4/43.7/255

## 2016-10-29 NOTE — Telephone Encounter (Signed)
ATC line busy-will need to try again tomorrow morning.

## 2016-10-29 NOTE — Telephone Encounter (Signed)
LABS 10/16/16 CBC: 16.1/13.8/41.3/279

## 2016-10-30 DIAGNOSIS — A419 Sepsis, unspecified organism: Secondary | ICD-10-CM | POA: Diagnosis not present

## 2016-10-30 DIAGNOSIS — N390 Urinary tract infection, site not specified: Secondary | ICD-10-CM | POA: Diagnosis not present

## 2016-10-30 DIAGNOSIS — R079 Chest pain, unspecified: Secondary | ICD-10-CM | POA: Diagnosis not present

## 2016-10-30 DIAGNOSIS — E2740 Unspecified adrenocortical insufficiency: Secondary | ICD-10-CM | POA: Diagnosis not present

## 2016-10-30 MED ORDER — MYCOPHENOLATE MOFETIL 500 MG PO TABS: 1000.0000 mg | ORAL_TABLET | Freq: Two times a day (BID) | ORAL | 1 refills | Status: DC

## 2016-10-30 NOTE — Telephone Encounter (Signed)
Spoke with the pt  He is requesting to reschedule 19mw and ov with JN He is being d/c'ed from the hospital today, and wants to rest  I have rescheduled appts and also refilled cellcept per his req Nothing further needed

## 2016-10-31 ENCOUNTER — Ambulatory Visit: Payer: Medicare PPO

## 2016-10-31 ENCOUNTER — Ambulatory Visit: Payer: Medicare PPO | Admitting: Pulmonary Disease

## 2016-11-11 ENCOUNTER — Other Ambulatory Visit: Payer: Medicare PPO

## 2016-11-18 ENCOUNTER — Ambulatory Visit (INDEPENDENT_AMBULATORY_CARE_PROVIDER_SITE_OTHER): Payer: Medicare PPO | Admitting: Pulmonary Disease

## 2016-11-18 ENCOUNTER — Encounter: Payer: Self-pay | Admitting: Pulmonary Disease

## 2016-11-18 ENCOUNTER — Ambulatory Visit (INDEPENDENT_AMBULATORY_CARE_PROVIDER_SITE_OTHER): Payer: Medicare PPO | Admitting: *Deleted

## 2016-11-18 ENCOUNTER — Other Ambulatory Visit: Payer: Medicare PPO

## 2016-11-18 DIAGNOSIS — J439 Emphysema, unspecified: Secondary | ICD-10-CM

## 2016-11-18 DIAGNOSIS — J849 Interstitial pulmonary disease, unspecified: Secondary | ICD-10-CM | POA: Diagnosis not present

## 2016-11-18 DIAGNOSIS — D899 Disorder involving the immune mechanism, unspecified: Secondary | ICD-10-CM | POA: Diagnosis not present

## 2016-11-18 DIAGNOSIS — K219 Gastro-esophageal reflux disease without esophagitis: Secondary | ICD-10-CM | POA: Diagnosis not present

## 2016-11-18 DIAGNOSIS — J9611 Chronic respiratory failure with hypoxia: Secondary | ICD-10-CM

## 2016-11-18 DIAGNOSIS — D849 Immunodeficiency, unspecified: Secondary | ICD-10-CM

## 2016-11-18 NOTE — Progress Notes (Signed)
SIX MIN WALK 11/18/2016 08/30/2016 07/18/2016 05/22/2016 11/20/2015 07/17/2015 08/23/2014  Medications allopurinol 100mg , lipitor 80mg , vitamin b12 and D, lasix 40mg ,metoprolol 25mg , omeprazole 20mg , and prednisone 10mg . Patient unable to remember what time.  None cellcept 500mg  and prednisone 40mg  taken at 8:00am - NONE Tylenol 325mg , Zyloprim 100mg , ASA 81, Calcium, Vit B12, Cellcept 500mg , Prilosec 20mg , Pravachol 80mg  and Prednisone 10mg  -- ALL @ 9am -  Supplimental Oxygen during Test? (L/min) No Yes Yes No No No No  O2 Flow Rate - 5 3 - - - -  Type - Continuous Continuous - - - -  Laps 2 3 3  - 2 4 -  Partial Lap (in Meters) 18 24 24  - 0 0 -  Baseline BP (sitting) 120/74 120/78 128/68 - 114/72 132/86 -  Baseline Heartrate 75 64 80 - 110 90 -  Baseline Dyspnea (Borg Scale) 2 3 0 - 3 3 -  Baseline Fatigue (Borg Scale) 1 0.5 0 - 1 0.5 -  Baseline SPO2 100 97 94 - 95 95 -  BP (sitting) 128/76 130/82 142/80 - 128/68 144/82 -  Heartrate 104 118 121 - 125 131 -  Dyspnea (Borg Scale) 2 4 4  - 4 5 -  Fatigue (Borg Scale) 3 2 0 - 4 3 -  SPO2 93 92 94 - 83 82 -  BP (sitting) 126/76 128/80 136/78 - 110/66 128/72 -  Heartrate 71 85 85 - 102 103 -  SPO2 97 100 100 - 95 94 -  Stopped or Paused before Six Minutes Yes Yes Yes - Yes No -  Other Symptoms at end of Exercise Stopped with 220 left. O2 was at 89%, HR:119. Increased SOB.  Stopped at 5:03 due to SOB, 86% 109p, placed on 5L of o2. Stopped again at 2:53 due to SOB, remained on 5L of o2, 93% 116p. Patient completed walk with the use of a walker. Walked a slow pace.  apply O2.  - Pt walked only 2 laps and took 2 long breaks during the test d/t SOB and chest tightness - -  Interpretation - Leg pain Leg pain - - Leg pain -  Distance Completed 114 168 168 - 96 192 -  Tech Comments: Patient completed walk with 2L of O2. He walked at his normal pace and with a walker. He had to stop with 220 left due to SOB. Denied any other symptoms. Pt was able to resume test.   - pt started test on roomair, test paused with 4:11 left of test due to pt's O2 dropping to 86 pt was placed on 2L and test resumed. pt O2 dropped to 87 with 2:28 left test was paused at 66 meters and O2 was increased to 3L. test was pefromed with forehead prob, pt did walk with walker. pt c/o leg pain but states this is normal with this amount of exertion.  after 30 secs, sats dropped to 84% ra--increased to 90% on 2lpm pulsed o2 and then steadily dropped to 80% just standing--increased to 5lpm pulsed and sats increased to 96%, walked until 2:51 min remaing and stopped due to knee pain- sats dropped from 90% to 81% on the 5lpm pulsed while resting- pt placed on 6lpm cont o2 and walked until 30 secs remaing and mainted sat above 90%.  Pt walked 2 laps, took 2 long breaks d/t SOB and chest tightness. Pt's O2 was 83% upon stopping 6MW and took the 2 minutes for the O2 to recover. Pt was then titrated on O2 for  6 min - Pt required 2 liters O2 to maintain O2 above 90% for his level of exertion. Pt again took 2 long breaks during O2 Titration, not allowing his O2 to drop very low, lowest O2 sat was 88%. O2 was turned on to 2 liters at this this point and pt completed 1 lap with O2 on and maintained levels above 90%.  Pt completed full 6MW, no pauses, c/o chest tightness and leg pain which slowed his pace. Pt was titrated on 2Liters O2 at end of test d/t desat (see Patient Care Coordination notes) - pt's O2 controlled with 2 Liters O2 - Pt states that this was a lot of walking compared to his normal activites and amount he walks daily. Pt denied any chest tightness while doing the O2 titration --AMG O2 sat 86% RA at approx 3/4 of 1st lap with pulse of 101.  2lpm continuous o2 placed on pt. Sats increased to 95% with pulse of 89.

## 2016-11-18 NOTE — Progress Notes (Signed)
Subjective:    Patient ID: Jeff Lynch, male    DOB: 14-May-1940, 76 y.o.   MRN: [id number]  C.C.:  Follow-up for ILD, Chronic Hypoxic Respiratory Failure, Immunosuppressed Status, & GERD.  HPI Since last appointment patient underwent endovascular stent placement for his AAA by Dr. Donnetta Hutching.   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 Dose of CellCept increased to 1000 mg twice a day at last appointment & prednisone taper started by 10 mg every 4 weeks. He feels like his breathing has returned to baseline. He is doing physical therapy. He reports he has been at 38m since last appointment for the last 3 months.   Chronic hypoxic respiratory failure: Oxygen requirement increased to 5 L/m with exertion at last appointment. Previously patient was requiring 2-3 L/m with exertion. Patient was having some difficulty regulating lower extremity edema with Lasix. He has had improvement in his edema today. Patient demonstrated a 2 L/m oxygen requirement with exertion today. He is adjusting his oxygen flow rate intermittently depending on how he feels.   Immunosuppressed status: Prescribed with prednisone and CellCept. Prescribed Bactrim DS on Monday, Wednesday, and Friday for PCP prophylaxis.  GERD: Previously only using Prilosec only intermittently. No reflux or dyspepsia recently. No morning brash water taste.   Review of Systems  He reports no recent chills, sweats, or fever. No chest pain, pressure, or tightness. No new rashes. Continues to have bruising.   No Known Allergies  Current Outpatient Prescriptions on File Prior to Visit  Medication Sig Dispense Refill  . acetaminophen (TYLENOL) 325 MG tablet Take 325 mg by mouth every 6 (six) hours as needed. Reported on 05/10/2015    . allopurinol (ZYLOPRIM) 100 MG tablet Take 1 tablet by mouth daily.    .Marland Kitchenaspirin 81 MG tablet Take 81 mg by mouth daily.    .Marland Kitchenatorvastatin (LIPITOR) 80 MG tablet Take 80 mg by mouth daily.    .Marland KitchenCALCIUM PO Take 1  tablet by mouth 2 (two) times daily.     . furosemide (LASIX) 40 MG tablet Take 40 mg by mouth daily.     . metoprolol succinate (TOPROL-XL) 25 MG 24 hr tablet Take 2 tablets by mouth daily.    . mycophenolate (CELLCEPT) 500 MG tablet Take 2 tablets (1,000 mg total) by mouth 2 (two) times daily. 120 tablet 1  . nitroGLYCERIN (NITROSTAT) 0.4 MG SL tablet Place 0.4 mg under the tongue every 5 (five) minutes as needed for chest pain.     .Marland Kitchenomeprazole (PRILOSEC) 20 MG capsule Take 1 capsule (20 mg total) by mouth daily. (Patient taking differently: Take 20 mg by mouth daily as needed. As needed) 30 capsule 6  . potassium chloride SA (K-DUR,KLOR-CON) 20 MEQ tablet Take 20 mEq by mouth daily.  1  . predniSONE (DELTASONE) 10 MG tablet Take 30 mg by mouth daily.    . vitamin B-12 (CYANOCOBALAMIN) 1000 MCG tablet Take 1 tablet by mouth daily.    . Vitamin D, Ergocalciferol, (DRISDOL) 50000 UNITS CAPS capsule Take 50,000 Units by mouth every 7 (seven) days. Reported on 08/07/2015     No current facility-administered medications on file prior to visit.     Past Medical History:  Diagnosis Date  . Acute pain of right shoulder   . Aortic stenosis    mild-moderate AS 07/2016   . Cervical pain   . [place] with [place]   . Coronary artery disease   . Cough   . Dyspnea   .  Esophagitis, reflux   . GERD (gastroesophageal reflux disease)    ocassionally  . Gout   . Hyperlipemia   . ILD (interstitial lung disease) ([place])    Likely Secondary to RA  . Impotence, organic   . Knee joint pain   . Prostate cancer ([place])   . Rheumatoid arthritis ([place])   . SOB (shortness of breath)   . Vitamin B12 deficiency   . Vitamin D deficiency     Past Surgical History:  Procedure Laterality Date  . ABDOMINAL AORTIC ENDOVASCULAR STENT GRAFT N/A 10/02/2016   Procedure: ABDOMINAL AORTIC ENDOVASCULAR STENT GRAFT/GORE;  Surgeon: Rosetta Posner, MD;  Location: [facility];  Service: Vascular;  Laterality:  N/A;  . BACK SURGERY  1990  . CARDIAC CATHETERIZATION     07/19/16 [facility]: 30% LM, 90% pLAD, 30% OM1, 60% mCx, 30% OM2, 50% pRCA. EF 75%. Medical tx. Consider PCI if failed med tx. Not a CABG candidate.  . CYSTOSCOPY N/A 10/02/2016   Procedure: CYSTOSCOPY;  Surgeon: Bjorn Loser, MD;  Location: [facility];  Service: Urology;  Laterality: N/A;  . HERNIA REPAIR     1960's  . PROSTATECTOMY  2003    Family History  Problem Relation Age of Onset  . Liver disease Father   . Prostate cancer Brother   . Lung disease Neg Hx   . Rheumatologic disease Neg Hx     Social History   Social History  . Marital status: Single    Spouse name: N/A  . Number of children: N/A  . Years of education: N/A   Occupational History  . [employer]     Stocking  . [employer]     retired   Social History Main Topics  . Smoking status: Former Smoker    Packs/day: 1.00    Years: 40.00    Types: Cigarettes    Quit date: 01/02/2002  . Smokeless tobacco: Never Used  . Alcohol use No  . Drug use: No  . Sexual activity: Not Asked   Other Topics Concern  . None   Social History Narrative   Originally from [place]. Always lived in [place]. Prior travel to [place], [place], [place], [place], & [place]. No international travel. 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 No known asbestos exposure. Remote exposure to birds/parakeets briefly. Has a dog currently. No mold exposure.       Objective:   Physical Exam BP 138/80 (BP Location: Left Arm, Cuff Size: Normal)   Pulse 66   SpO2 (!) 89%   General:  Awake. Comfortable. No distress. Integument:  Warm. Dry. No rash on exposed skin. Bruising of various ages on upper forearms. Extremities:  No cyanosis. HEENT: Poor dentition. Moist pedis membranes. No oral ulcers. Cardiovascular: Pitting bilateral lower extremity slightly better than last appointment. Regular rate. No appreciable JVD.  Pulmonary:  Minimal basilar crackles relatively unchanged.  Normal work of breathing on supplemental oxygen. Abdomen: Soft. Normal bowel sounds. Protuberant. Musculoskeletal:  Normal bulk and tone. No joint effusion appreciated.  PFT 07/18/16: FVC 2.39 L (67%) FEV1 1.97 L (77%) FEV1/FVC 0.83 FEF 25-75 2.13 L (116%)  DLCO corrected 37% 05/22/16: FVC 2.31 L (64%) FEV1 1.86 L (72%) FEV1/FVC 0.81 FEF 25-75 1.82 L (98%)                                                                                                                      DLCO corrected 35% 02/20/16: FVC 2.57 L (71%) FEV1 2.14 L (83%) FEV1/FVC 0.83 FEF 25-75 2.36 L (126%)                                                                                                                    DLCO corrected 43% (Hgb 14.3) 11/20/15: FVC 2.55 L (71%) FEV1 2.13 L (82%) FEV1/FVC 0.84 FEF 25-75 2.40 L (128%)                                                                                                                    DLCO corrected 37% (Hgb 15.7) 07/17/15: FVC 2.65 L (73%) FEV1 2.18 L (84%) FEV1/FVC 0.83 FEF 25-75 2.42 L (128%) no bronchodilator response TLC 4.09 L (65%) RV 56% ERV 63% DLCO corrected 41% (Hgb 16.3)  6MWT 11/18/16:  Walked 114 meters / Baseline Sat 100% on 2 L/m / Nadir Sat 89% on 2 L/m at 3:40 (required 2 L/m to maintain with exertion; stopped at 3:40 w/ shortness of breath) 08/30/16:  Walked 168 meters / Baseline Sat 97% on RA / Nadir Sat 86% on RA @ 5:03 (requrired 5 L/m to maintain) 07/18/16:  Walked 168 meters / Baseline Sat 94% on RA / Nadir Sat 86% on RA @ 4:11 (required 3 L/m with exertion to maintain) 05/22/16:  Baseline Sat 84% on RA / Required 2 L/m pulse dose at rest / Requred 6 L/m continuous flow with exertion to maintain Sat >90% 11/20/15:  96 meters / Baseline Sat 95% on RA / Nadir Sat 83% on RA (required 2 L/m to maintain saturation & took multiple breaks during  the walk with pain) 07/17/15: 192 meters / Baseline Sat 95% on RA / Nadir  Sat 82% on RA at End of Walk (required 2 L/m to maintain w/ exertion) 08/23/14:  Nadir saturation 86% on RA - Saturated 93% on 2 L/m continuous  IMAGING HRCT CHEST W/O 09/06/16 (personally reviewed by me):  Predominantly UIP pattern without significant progression. Centrilobular and paraseptal emphysema noted with some apical predominance but subpleural bleb formation is also noted within the lower lung zones. No pleural effusion or thickening. No pericardial effusion. No pathologic mediastinal adenopathy.  CXR PA/LAT 03/22/16 (previously reviewed by me): Chronic prominent bilateral interstitial markings and reticulation. No focal opacity or mass appreciated. No pleural effusion. Heart normal in size & mediastinum normal in contour.  HRCT CHEST W/O 06/26/15 (previously reviewed by me): Borderline enlarged mediastinal lymph nodes measuring up to 1.2 cm in short axis. Slightly basilar predominant subpleural reticular changes with some traction bronchiectasis and minimal groundglass. No appreciable honeycombing changes. No other parenchymal nodule or opacity appreciated. Centrilobular emphysema is also present. No pleural effusion or thickening. No pericardial effusion.  CT CHEST W/O 12/31/13 (per radiologist):Numerous scattered lymph nodes in mediastinum including 1.2 cm short axis AP window lymph node. Coarse peripheral interstitial lung disease likely with early honeycombing. Underlying centrilobular emphysema. Nodule in left midlung. No other separate nodule identified. Thoracic spondylosis.  CARDIAC TTE (06/26/15): Mild LVH. EF 60-65%. LA mildly dilated. Mild to moderate aortic stenosis with mild regurgitation. Trivial mitral regurgitation. Left atrium mildly dilated.  LABS 10/16/16 CBC: 16.1/13.8/41.3/279  08/23/16 CBC: 10.7/14.5/43.1/145 BMP: 139/3.9/96/26/13/1.21/125/9.1 LFT: 3.8/6.0/1.4/63/23/18  06/19/16 Lynch:   29 Creatinine 1.3  11/15/15 CBC: 11.3/15.0/44.8/231 BMP: 142/4.7/100/26/19/1.07/111/10.5 LFT: 4.0/7.0/0.6/65/17/13  05/15/15 CBC: 10.6/15.7/46.5/250 BMP: 139/4.8/98/28/14/1.02/117/10.3 LFT: 4.1/7.4/0.5/69/15/13  11/16/14 BMP: 140/4.5/101/23/11/0.79/119/9.2 LFT: 3.9/6.4/0.5/56/16/13 CBC: 9.0/14.8/45.2/199  01/26/14 CK: 104 ESR: 87 ANA: Negative Anti-CCP: 231.3 RF: 503    Assessment & Plan:  76 y.o. 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 Patient's oxygen requirement has improved significantly. I believe that further tapering his oral prednisone is reasonable. I will continue the patient on his current dose of CellCept. He is concerned given the co-pay for CellCept and inquired regarding alternative treatment options. Imuran would certainly be worth considering. Reviewing his high-resolution CT scan does show underlying emphysema but this could also represent cystic changes. Given his significant history of tobacco use this is also possible as a cause for the cystic and emphysematous changes. I instructed the patient to contact me if he had any new breathing problems or questions before his next appointment.  1. ILD:  Continuing CellCept 1000 mg twice a day. Patient to investigate co-pay on Imuran in place of CellCept. Continuing prednisone taper to baseline 10 mg daily. 2. Chronic hypoxic respiratory failure:  Continuing patient on oxygen at 2 L/m with exertion given 6 minute walk test today. 3. Immunosuppressed status:  Patient to resume Bactrim DS on Monday, Wednesday, and Friday. Resuming serum lab tests every 3 months. 4. Pulmonary emphysema:  Screening for alpha-1 antitrypsin deficiency. Holding on inhaler medication. 5. GERD: Holding on regular medication. 6. Health maintenance:  Status post Pneumovax September 2015 & Prevnar March 2017. Recommended influenza vaccine at next PCP appointment. 7. Follow-up: Return to clinic in 3 months  or sooner if needed.  Sonia Baller Ashok Cordia, M.D. [facility] Pulmonary [facility] Pager:  [phone] After 3pm or if no response, call [phone] 3:21 PM 11/18/16

## 2016-11-18 NOTE — Patient Instructions (Addendum)
   Call me if you have any new breathing problems before your next appointment.  We are keeping your Cellcept/Mycophenolate at 1000mg  twice daily.  We are going to start tapering your Prednisone:  Start taking 2 tablets (20mg ) daily for the next 6 weeks, then  Go back to your 1 tablet (10mg ) daily until we see you back in office.  Keep using the oxygen at 2 L/m.  Remember to get your Flu shot when you see your primary care doctor.   You can check with your insurance to see if Imuran also called Azathioprine would be better covered than the CellCept (Mycophenolate).   Make sure you are back on your Bactrim DS once you finish your current antibiotic. You will be taking that on Monday, Wednesday, & Friday as before.   TESTS ORDERED: 1. Serum Alpha-1 Antitrypsin Phenotype today

## 2016-11-18 NOTE — Addendum Note (Signed)
Addended by: Della Goo C on: 11/18/2016 03:59 PM   Modules accepted: Orders

## 2016-11-19 ENCOUNTER — Telehealth: Payer: Self-pay | Admitting: Vascular Surgery

## 2016-11-19 NOTE — Telephone Encounter (Signed)
-----   Message from Margy Clarks, LPN sent at 0/16/5537 12:56 PM EDT ----- Regarding: RE: CTA RE-PRECERT? Josem Kaufmann is still good  [id number] Expires 12/11/2016 ----- Message ----- From: Georgiann Mccoy Sent: 11/19/2016  10:51 AM To: Margy Clarks, LPN Subject: CTA RE-PRECERT?                                HI Allie,  This pt missed their cta at [place] [facility]. I didn't know if you had to precert it again.  CTA ABD/PEL  I71.4  [place]  TFE

## 2016-11-19 NOTE — Telephone Encounter (Signed)
Reshed MD appt 12/24/16 at 3:00. Resched CTA at [facility] 11/22/16 at 1:15. Spoke to pt.

## 2016-11-20 ENCOUNTER — Encounter: Payer: Medicare PPO | Admitting: Vascular Surgery

## 2016-11-21 LAB — ALPHA-1 ANTITRYPSIN PHENOTYPE: A-1 Antitrypsin, Ser: 150 mg/dL (ref 83–199)

## 2016-11-25 ENCOUNTER — Telehealth: Payer: Self-pay | Admitting: Pulmonary Disease

## 2016-11-25 NOTE — Telephone Encounter (Signed)
Spoke with pt, he needs a cheaper co-pay of the medication Imuran instead of mocrophenalate. He would like a 30 day supply to see how much [employer] will pay. Can we send in Rx? Please advise.

## 2016-11-27 MED ORDER — AZATHIOPRINE 50 MG PO TABS: 50.0000 mg | ORAL_TABLET | Freq: Every day | ORAL | 0 refills | Status: DC

## 2016-11-27 NOTE — Telephone Encounter (Signed)
You can send in a prescription for Imuran 50mg  daily - #30 - to see what his co-pay would be. The dose would be gradually increased from 50mg  daily just like was done with CellCept. But that may give him an idea. Please make sure we are sending in for the generic of both of these drugs:  Azathioprine & Mycophenolate. That might make a difference. He should not start taking the medication though and should notify us if it is going to be cheaper and he wants to try switching. The dose of Imuran would be gradually increased up to roughly 3-4 tablets (150-200mg ) daily.

## 2016-11-27 NOTE — Telephone Encounter (Signed)
Called and spoke to pt. Informed him of the recs per JN. Generic rx sent to preferred pharmacy. Pt verbalized understanding. [place] and spoke to pharmacy tech and advised them not to fill the rx, just to price check the rx. Pt aware of this and will contact us back if he can afford the Imuran.

## 2016-11-27 NOTE — Telephone Encounter (Signed)
JN please advise. Thanks. 

## 2016-12-02 NOTE — Telephone Encounter (Signed)
Called and spoke to pt. Pt states the co-pay for Imuran for #30 is $15, pt states he would like to start the Imuran.   Dr. Ashok Cordia please advise if ok for pt to stop the Cellcept and start the Imuran. Thanks.

## 2016-12-03 MED ORDER — AZATHIOPRINE 50 MG PO TABS: ORAL_TABLET | ORAL | 1 refills | Status: DC

## 2016-12-03 NOTE — Telephone Encounter (Signed)
Called and spoke with pt and he is aware of JN recs.  He stated that he will get [place] labs done in [place] like he did last time and have them sent to [facility].    He is aware of appt on Oct 30.  Nothing further is needed.

## 2016-12-03 NOTE — Telephone Encounter (Signed)
1. Go ahead and send in the prescription for Azathioprine 50mg  tab - Take 1 by mouth daily for 15 days then take 1 by mouth bid until we see him in office- #60 - #1 refill.  2. He needs to stop the CellCept/Mycophenolate but should continue the Prednisone at his current dose.  3. We need a CBC with differential every week for 4 weeks.  4. Please arrange an appointment to see him back in 4 weeks so we can up-titrate his Azathioprine. This should be with me.  Thanks.

## 2016-12-04 ENCOUNTER — Telehealth: Payer: Self-pay | Admitting: Pulmonary Disease

## 2016-12-04 NOTE — Telephone Encounter (Signed)
Spoke with pt, verified questions about bloodwork.  Nothing further needed.

## 2016-12-04 NOTE — Telephone Encounter (Signed)
LABS  11/25/16 CBC: 11.6/13.4/40.0/200 MCV: 92 BMP: 143/4.2/101/28/19/1.08/97/10.0 LFT: 4.0/6.2/0.3/67/17/60

## 2016-12-11 ENCOUNTER — Encounter: Payer: Self-pay | Admitting: Vascular Surgery

## 2016-12-12 ENCOUNTER — Telehealth: Payer: Self-pay | Admitting: Pulmonary Disease

## 2016-12-12 DIAGNOSIS — Z5181 Encounter for therapeutic drug level monitoring: Secondary | ICD-10-CM

## 2016-12-12 NOTE — Telephone Encounter (Signed)
Spoke with pt, he lives in [place], and wants to get his bloodwork done with Dr. Wende Neighbors. I can fax an order but want to make sure the only test needed is an Alpha 1  JN please advise.

## 2016-12-12 NOTE — Telephone Encounter (Signed)
Please refer to my phone note on 10/2. He needs a CBC with differential every week for 4 weeks until he sees me in office. We started him on Imuran in place of CellCept. But yes it is fine if he gets this done through Dr. Micheal Likens. Thanks.

## 2016-12-12 NOTE — Telephone Encounter (Signed)
Left message for patient

## 2016-12-13 NOTE — Telephone Encounter (Signed)
Called and spoke to pt. Informed him of the recs per JN. Order can be faxed to lab with Dr. Viviann Spare office. CBC lab order placed and order req can be faxed to [phone]. Out phone lines and fax machines are down so we cannot fax the req over at this time, pt aware.   Will need to fax the req on Monday. This is on sickie board in triage.

## 2016-12-16 NOTE — Telephone Encounter (Signed)
This fax has been resent to Dr. Viviann Spare office.  Nothing further needed.

## 2016-12-18 ENCOUNTER — Other Ambulatory Visit: Payer: Medicare PPO

## 2016-12-18 DIAGNOSIS — Z5181 Encounter for therapeutic drug level monitoring: Secondary | ICD-10-CM

## 2016-12-18 LAB — CBC WITH DIFFERENTIAL/PLATELET
BASOS ABS: 0.1 10*3/uL (ref 0.0–0.1)
BASOS PCT: 0.5 % (ref 0.0–3.0)
EOS ABS: 0.3 10*3/uL (ref 0.0–0.7)
Eosinophils Relative: 2.7 % (ref 0.0–5.0)
HCT: 39.9 % (ref 39.0–52.0)
Hemoglobin: 13.1 g/dL (ref 13.0–17.0)
LYMPHS ABS: 1.8 10*3/uL (ref 0.7–4.0)
Lymphocytes Relative: 18.6 % (ref 12.0–46.0)
MCHC: 33 g/dL (ref 30.0–36.0)
MCV: 93.6 fl (ref 78.0–100.0)
MONO ABS: 0.7 10*3/uL (ref 0.1–1.0)
Monocytes Relative: 7.3 % (ref 3.0–12.0)
NEUTROS ABS: 6.8 10*3/uL (ref 1.4–7.7)
NEUTROS PCT: 70.9 % (ref 43.0–77.0)
PLATELETS: 168 10*3/uL (ref 150.0–400.0)
RBC: 4.26 Mil/uL (ref 4.22–5.81)
RDW: 14.8 % (ref 11.5–15.5)
WBC: 9.6 10*3/uL (ref 4.0–10.5)

## 2016-12-18 NOTE — Addendum Note (Signed)
Addended by: Isaiah Serge D on: 12/18/2016 12:36 PM   Modules accepted: Orders

## 2016-12-19 ENCOUNTER — Telehealth: Payer: Self-pay | Admitting: Pulmonary Disease

## 2016-12-19 NOTE — Telephone Encounter (Signed)
Left message for patient

## 2016-12-20 NOTE — Telephone Encounter (Signed)
ATC pt, no answer. Left message for pt to call back.  

## 2016-12-23 NOTE — Telephone Encounter (Signed)
lmtcb for pt.  

## 2016-12-24 ENCOUNTER — Encounter: Payer: Self-pay | Admitting: Vascular Surgery

## 2016-12-24 ENCOUNTER — Ambulatory Visit (INDEPENDENT_AMBULATORY_CARE_PROVIDER_SITE_OTHER): Payer: Self-pay | Admitting: Vascular Surgery

## 2016-12-24 VITALS — BP 138/80 | HR 84 | Temp 97.6°F | Resp 21 | Ht 67.0 in | Wt 199.0 lb

## 2016-12-24 DIAGNOSIS — I714 Abdominal aortic aneurysm, without rupture, unspecified: Secondary | ICD-10-CM

## 2016-12-24 NOTE — Telephone Encounter (Signed)
Will close this message per triage protocol.

## 2016-12-24 NOTE — Progress Notes (Signed)
POST OPERATIVE OFFICE NOTE    CC:  F/u for surgery  HPI:  This is a 76 y.o. male who is s/p EVAR on 10/02/16.  There was difficulty with the foley catheter insertion prior to surgery and urology was consulted for assistance. He underwent cystoscopy and balloon dilation and a foley catheter was placed. A perclose was unsuccessful in the left groin and he required left groin cutdown.  He was discharged home with the foley catheter.  He states he had an appointment to have the foley removed but could not afford the co-pay and an appointment was made for the next week when he got his check.  His [place] removed the catheter the next week.  A couple days later, the pt states he started having fever and chills and passed out.  He tells me that he was admitted to [facility] and diagnosed with a UTI and PNA.  He states that at one point, his HR was down in the 20's but this rebounded.  He has an appointment with his cardiologist tomorrow and his PCP on Thursday.  He states he has a painful wound on the left medial aspect of his leg above his ankle.  He states he has swelling in this leg.  He has had wound before and healed with wraps, but this one has not healed.   No Known Allergies  Current Outpatient Prescriptions  Medication Sig Dispense Refill  . acetaminophen (TYLENOL) 325 MG tablet Take 325 mg by mouth every 6 (six) hours as needed. Reported on 05/10/2015    . allopurinol (ZYLOPRIM) 100 MG tablet Take 1 tablet by mouth daily.    [name] aspirin 81 MG tablet Take 81 mg by mouth daily.    [name] atorvastatin (LIPITOR) 80 MG tablet Take 80 mg by mouth daily.    [name] azaTHIOprine (IMURAN) 50 MG tablet Take one tablet daily for 15 days then increase to 1 tablet two times daily until seen on 10/30 60 tablet 1  . CALCIUM PO Take 1 tablet by mouth 2 (two) times daily.     . furosemide (LASIX) 40 MG tablet Take 40 mg by mouth daily.     . metoprolol succinate (TOPROL-XL) 25 MG 24 hr tablet Take 2 tablets by mouth daily.     . nitroGLYCERIN (NITROSTAT) 0.4 MG SL tablet Place 0.4 mg under the tongue every 5 (five) minutes as needed for chest pain.     [name] omeprazole (PRILOSEC) 20 MG capsule Take 1 capsule (20 mg total) by mouth daily. (Patient taking differently: Take 20 mg by mouth daily as needed. As needed) 30 capsule 6  . potassium chloride SA (K-DUR,KLOR-CON) 20 MEQ tablet Take 20 mEq by mouth daily.  1  . predniSONE (DELTASONE) 10 MG tablet Take 30 mg by mouth daily.    . vitamin B-12 (CYANOCOBALAMIN) 1000 MCG tablet Take 1 tablet by mouth daily.    . Vitamin D, Ergocalciferol, (DRISDOL) 50000 UNITS CAPS capsule Take 50,000 Units by mouth every 7 (seven) days. Reported on 08/07/2015     No current facility-administered medications for this visit.      ROS:  See HPI  Physical Exam:  Vitals:   12/24/16 1511  BP: 138/80  Pulse: 84  Resp: (!) 21  Temp: 97.6 F (36.4 C)  SpO2: 97%    Incision:  Well healed left groin incision. Extremities:  Easily palpable right AT pulse; left with biphasic doppler signals in the left DP/PT; 2+ pitting edema left leg.  Left medial lower leg    Abdomen:  Non tender to palpation.  Soft.    Assessment/Plan:  This is a 76 y.o. male who is s/p: [place] stent graft repair of AAA on 10/02/16 by Dr. Donnetta Hutching  -post operative events noted above -pt doing well from surgical standpoint and incision has healed. -he does have a venous ulcer present on the lower left leg medially above the ankle with 2+ pitting edema.  He has biphasic doppler signals.  He has had an IT trainer on until this past Monday when he asked the [facility] RN to leave it off so that MD could evaluate. -will re-wrap leg with una boot today and continue with [place] until wound is healed. -he will f/u in 6 months with EVAR u/s protocol.   Leontine Locket, PA-C [facility] [phone]  Clinic MD:  Pt seen and examined with Dr. Donnetta Hutching  I have examined the patient, reviewed and agree with  above.  Curt Jews, MD 12/24/2016 4:46 PM

## 2016-12-26 NOTE — Addendum Note (Signed)
Addended by: Lianne Cure A on: 12/26/2016 01:15 PM   Modules accepted: Orders

## 2016-12-31 ENCOUNTER — Ambulatory Visit (INDEPENDENT_AMBULATORY_CARE_PROVIDER_SITE_OTHER): Payer: Medicare PPO | Admitting: Pulmonary Disease

## 2016-12-31 ENCOUNTER — Encounter: Payer: Self-pay | Admitting: Pulmonary Disease

## 2016-12-31 ENCOUNTER — Telehealth: Payer: Self-pay | Admitting: Pulmonary Disease

## 2016-12-31 VITALS — BP 138/78 | HR 72 | Ht 67.0 in | Wt 195.4 lb

## 2016-12-31 DIAGNOSIS — D849 Immunodeficiency, unspecified: Secondary | ICD-10-CM

## 2016-12-31 DIAGNOSIS — J849 Interstitial pulmonary disease, unspecified: Secondary | ICD-10-CM | POA: Diagnosis not present

## 2016-12-31 DIAGNOSIS — D899 Disorder involving the immune mechanism, unspecified: Secondary | ICD-10-CM

## 2016-12-31 DIAGNOSIS — J9611 Chronic respiratory failure with hypoxia: Secondary | ICD-10-CM

## 2016-12-31 MED ORDER — AZATHIOPRINE 50 MG PO TABS: ORAL_TABLET | ORAL | 3 refills | Status: DC

## 2016-12-31 MED ORDER — AZATHIOPRINE 50 MG PO TABS: 50.0000 mg | ORAL_TABLET | Freq: Every day | ORAL | 3 refills | Status: DC

## 2016-12-31 MED ORDER — AZATHIOPRINE 50 MG PO TABS: 50.0000 mg | ORAL_TABLET | Freq: Two times a day (BID) | ORAL | 3 refills | Status: DC

## 2016-12-31 NOTE — Progress Notes (Signed)
Subjective:    Patient ID: Jeff Lynch, male    DOB: [birth year], 76 y.o.   MRN: [id number]  C.C.:  Follow-up for ILD, Immunosuppressed Status, Chronic Hypoxic Respiratory Failure, Pulmonary Emphysema, & GERD.  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 Transitioned from CellCept 1000 mg twice a day at last appointment to Imuran due to cost. Prednisone decreased to 81m daily at last appointment. He has increased to 532mBID without any adverse effects. He feels like his dyspnea is slightly worse than before. He denies any wheezing. He reports a minimal cough producing a light gray mucus.   Immunosuppressed status: Currently on prednisone and Imuran. Prescribed Bactrim DS on Monday, Wednesday, and Friday for PCP prophylaxis.  Chronic hypoxic respiratory failure: Patient demonstrated a 2 L/m oxygen requirement with exertion at last appointment. Patient reports he is continuing to use oxygen as prescribed.   Pulmonary emphysema: Seen on CT imaging. No evidence of alpha-1 antitrypsin deficiency. Likely due to prior tobacco use. Currently not only any inhaler medications.   GERD:  Previously using Prilosec only intermittently. He reports no reflux or dyspepsia.   Review of Systems  Patient reports no new joint pain or swelling. No fever, chills, or sweats. No abdominal pain, nausea, or emesis.  No Known Allergies  Current Outpatient Prescriptions on File Prior to Visit  Medication Sig Dispense Refill  . acetaminophen (TYLENOL) 325 MG tablet Take 325 mg by mouth every 6 (six) hours as needed. Reported on 05/10/2015    . allopurinol (ZYLOPRIM) 100 MG tablet Take 1 tablet by mouth daily.    . Marland Kitchenspirin 81 MG tablet Take 81 mg by mouth daily.    . Marland Kitchentorvastatin (LIPITOR) 80 MG tablet Take 80 mg by mouth daily.    . Marland KitchenzaTHIOprine (IMURAN) 50 MG tablet Take one tablet daily for 15 days then increase to 1 tablet two times daily until seen on 10/30 60 tablet 1    . CALCIUM PO Take 1 tablet by mouth 2 (two) times daily.     . furosemide (LASIX) 40 MG tablet Take 40 mg by mouth daily.     . metoprolol succinate (TOPROL-XL) 25 MG 24 hr tablet Take 2 tablets by mouth daily.    . nitroGLYCERIN (NITROSTAT) 0.4 MG SL tablet Place 0.4 mg under the tongue every 5 (five) minutes as needed for chest pain.     . Marland Kitchenmeprazole (PRILOSEC) 20 MG capsule Take 1 capsule (20 mg total) by mouth daily. (Patient taking differently: Take 20 mg by mouth daily as needed. As needed) 30 capsule 6  . potassium chloride SA (K-DUR,KLOR-CON) 20 MEQ tablet Take 20 mEq by mouth daily.  1  . predniSONE (DELTASONE) 10 MG tablet Take 30 mg by mouth daily.    . vitamin B-12 (CYANOCOBALAMIN) 1000 MCG tablet Take 1 tablet by mouth daily.    . Vitamin D, Ergocalciferol, (DRISDOL) 50000 UNITS CAPS capsule Take 50,000 Units by mouth every 7 (seven) days. Reported on 08/07/2015     No current facility-administered medications on file prior to visit.     Past Medical History:  Diagnosis Date  . Acute pain of right shoulder   . Aortic stenosis    mild-moderate AS 07/2016   . Cervical pain   . Chronic respiratory failure with hypoxia ([place]  . Coronary artery disease   . Cough   . Dyspnea   . Esophagitis, reflux   . GERD (gastroesophageal reflux disease)  ocassionally  . Gout   . Hyperlipemia   . ILD (interstitial lung disease) ([place])    Likely Secondary to RA  . Impotence, organic   . Knee joint pain   . Prostate cancer ([place])   . Rheumatoid arthritis ([place])   . SOB (shortness of breath)   . Vitamin B12 deficiency   . Vitamin D deficiency     Past Surgical History:  Procedure Laterality Date  . ABDOMINAL AORTIC ENDOVASCULAR STENT GRAFT N/A 10/02/2016   Procedure: ABDOMINAL AORTIC ENDOVASCULAR STENT GRAFT/GORE;  Surgeon: Rosetta Posner, MD;  Location: [facility];  Service: Vascular;  Laterality: N/A;  . BACK SURGERY  1990  . CARDIAC CATHETERIZATION     07/19/16 [facility]: 30%  LM, 90% pLAD, 30% OM1, 60% mCx, 30% OM2, 50% pRCA. EF 75%. Medical tx. Consider PCI if failed med tx. Not a CABG candidate.  . CYSTOSCOPY N/A 10/02/2016   Procedure: CYSTOSCOPY;  Surgeon: Bjorn Loser, MD;  Location: [facility];  Service: Urology;  Laterality: N/A;  . HERNIA REPAIR     1960's  . PROSTATECTOMY  2003    Family History  Problem Relation Age of Onset  . Liver disease Father   . Prostate cancer Brother   . Lung disease Neg Hx   . Rheumatologic disease Neg Hx     Social History   Social History  . Marital status: Single    Spouse name: N/A  . Number of children: N/A  . Years of education: N/A   Occupational History  . [employer]     Stocking  . [employer]     retired   Social History Main Topics  . Smoking status: Former Smoker    Packs/day: 1.00    Years: 40.00    Types: Cigarettes    Quit date: 01/02/2002  . Smokeless tobacco: Never Used  . Alcohol use No  . Drug use: No  . Sexual activity: Not Asked   Other Topics Concern  . None   Social History Narrative   Originally from [place]. Always lived in [place]. Prior travel to [place], [place], [place], [place], & [place]. No international travel. 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 No known asbestos exposure. Remote exposure to birds/parakeets briefly. Has a dog currently. No mold exposure.       Objective:   Physical Exam BP 138/78 (BP Location: Right Arm, Cuff Size: Normal)   Pulse 72   Ht _0  (1.702 m)   Wt 195 lb 6 oz (88.6 kg)   SpO2 97%   BMI 30.60 kg/m   General: Centrally obese. No distress. Awake. Integument:  Bruising of various ages on upper extremities. No rash. Warm. Dry. Extremities:  No cyanosis or clubbing.  HEENT:  Minimal nasal turbinate swelling. Was noticed membranes. Poor dentition Cardiovascular:  Regular rate. Left lower extremity wrapped with edema. Mild right lower extremity edema. Unable to appreciate JVD.  Pulmonary:  Faint basilar crackles. Otherwise clear to  auscultation bilaterally. Normal work of breathing on nasal cannula. Abdomen: Soft. Normal active bowel sounds. Protuberant. Musculoskeletal:  Normal bulk and tone. No joint effusion appreciated. Neurological:  No meningismus. Cranial nerves grossly intact. Moving all 4 extremities equally.  PFT 07/18/16: FVC 2.39 L (67%) FEV1 1.97 L (77%) FEV1/FVC 0.83 FEF 25-75 2.13 L (116%)  DLCO corrected 37% 05/22/16: FVC 2.31 L (64%) FEV1 1.86 L (72%) FEV1/FVC 0.81 FEF 25-75 1.82 L (98%)                                                                                                                      DLCO corrected 35% 02/20/16: FVC 2.57 L (71%) FEV1 2.14 L (83%) FEV1/FVC 0.83 FEF 25-75 2.36 L (126%)                                                                                                                    DLCO corrected 43% (Hgb 14.3) 11/20/15: FVC 2.55 L (71%) FEV1 2.13 L (82%) FEV1/FVC 0.84 FEF 25-75 2.40 L (128%)                                                                                                                    DLCO corrected 37% (Hgb 15.7) 07/17/15: FVC 2.65 L (73%) FEV1 2.18 L (84%) FEV1/FVC 0.83 FEF 25-75 2.42 L (128%) no bronchodilator response TLC 4.09 L (65%) RV 56% ERV 63% DLCO corrected 41% (Hgb 16.3)  6MWT 11/18/16:  Walked 114 meters / Baseline Sat 100% on 2 L/m / Nadir Sat 89% on 2 L/m at 3:40 (required 2 L/m to maintain with exertion; stopped at 3:40 w/ shortness of breath) 08/30/16:  Walked 168 meters / Baseline Sat 97% on RA / Nadir Sat 86% on RA @ 5:03 (requrired 5 L/m to maintain) 07/18/16:  Walked 168 meters / Baseline Sat 94% on RA / Nadir Sat 86% on RA @ 4:11 (required 3 L/m with exertion to maintain) 05/22/16:  Baseline Sat 84% on RA / Required 2 L/m pulse dose at rest / Requred 6 L/m continuous flow with exertion to maintain Sat >90% 11/20/15:  96  meters / Baseline Sat 95% on RA / Nadir Sat 83% on RA (required 2 L/m to maintain saturation & took multiple breaks during the walk with pain) 07/17/15: 192 meters / Baseline Sat 95% on RA / Nadir  Sat 82% on RA at End of Walk (required 2 L/m to maintain w/ exertion) 08/23/14:  Nadir saturation 86% on RA - Saturated 93% on 2 L/m continuous  IMAGING HRCT CHEST W/O 09/06/16 (previously reviewed by me):  Predominantly UIP pattern without significant progression. Centrilobular and paraseptal emphysema noted with some apical predominance but subpleural bleb formation is also noted within the lower lung zones. No pleural effusion or thickening. No pericardial effusion. No pathologic mediastinal adenopathy.  CXR PA/LAT 03/22/16 (previously reviewed by me): Chronic prominent bilateral interstitial markings and reticulation. No focal opacity or mass appreciated. No pleural effusion. Heart normal in size & mediastinum normal in contour.  HRCT CHEST W/O 06/26/15 (previously reviewed by me): Borderline enlarged mediastinal lymph nodes measuring up to 1.2 cm in short axis. Slightly basilar predominant subpleural reticular changes with some traction bronchiectasis and minimal groundglass. No appreciable honeycombing changes. No other parenchymal nodule or opacity appreciated. Centrilobular emphysema is also present. No pleural effusion or thickening. No pericardial effusion.  CT CHEST W/O 12/31/13 (per radiologist):Numerous scattered lymph nodes in mediastinum including 1.2 cm short axis AP window lymph node. Coarse peripheral interstitial lung disease likely with early honeycombing. Underlying centrilobular emphysema. Nodule in left midlung. No other separate nodule identified. Thoracic spondylosis.  CARDIAC TTE (06/26/15): Mild LVH. EF 60-65%. LA mildly dilated. Mild to moderate aortic stenosis with mild regurgitation. Trivial mitral regurgitation. Left atrium mildly dilated.  LABS 12/18/16 CBC:  9.6/13.1/39.9/168  11/25/16 CBC: 11.6/13.4/40.0/200 MCV: 92 BMP: 143/4.2/101/28/19/1.08/97/10.0 LFT: 4.0/6.2/0.3/67/17/60  11/18/16 Alpha-1 antitrypsin: MM (150)  10/16/16 CBC: 16.1/13.8/41.3/279  08/23/16 CBC: 10.7/14.5/43.1/145 BMP: 139/3.9/96/26/13/1.21/125/9.1 LFT: 3.8/6.0/1.4/63/23/18  06/19/16 BUN:  29 Creatinine 1.3  11/15/15 CBC: 11.3/15.0/44.8/231 BMP: 142/4.7/100/26/19/1.07/111/10.5 LFT: 4.0/7.0/0.6/65/17/13  05/15/15 CBC: 10.6/15.7/46.5/250 BMP: 139/4.8/98/28/14/1.02/117/10.3 LFT: 4.1/7.4/0.5/69/15/13  11/16/14 BMP: 140/4.5/101/23/11/0.79/119/9.2 LFT: 3.9/6.4/0.5/56/16/13 CBC: 9.0/14.8/45.2/199  01/26/14 CK: 104 ESR: 87 ANA: Negative Anti-CCP: 231.3 RF: 503    Assessment & Plan:  76 y.o. 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 As we have transitioned his immunosuppression over from CellCept to Imuran the patient's respiratory status seems to be stable. He is exhibiting no new symptoms at this time. As such, I am continuing on his current dose of Imuran and prednisone. I am repeating pulmonary function testing at his follow-up appointment and would recommend considering further taper of his prednisone. Overall his oxygenation seems to be stable with his saturation on vitals today. He continues to exhibit minimal to no reflux; thus, I have not initiated daily gastric acid suppression medication. His pulmonary emphysema may be contributing somewhat to his symptoms but I am deferring inhaler medications pending his spirometry with bronchodilator challenge at follow-up appointment. I instructed the patient to contact our office if he had any new breathing problems or questions before his next appointment.  1. ILD:  Continuing patient on Prednisone 18m daily & Imuran 556mBID. Repeat Spirometry with DLCO and 6MWT at next appointment. 2. Immunosuppressed status:  Continuing on Bactrim DS on Monday, Wednesday, & Friday for  PCP prophylaxis. Checking serum CBC in 4 weeks.  3. Chronic hypoxic respiratory:  Continuing on oxygen as prescribed at 2 L/m with exertion. Repeat 6MWT at next appointment. 4. GERD:  Minimal symptoms. No new medication. 5. Pulmonary emphysema:  Holding on inhaler medications at this time. Repeating spirometry with bronchodilator challenge at next appointment. 6. Health maintenance:  Status post Pneumovax September 2015 & Prevnar March 2017. Just had his Flu Vaccine. 7. Follow-up: Return to clinic in 6 weeks or sooner if needed.  Sonia Baller Ashok Cordia, M.D. [facility] Pager:  [phone] After 3pm or if no response, call [phone] 12:44 PM 12/31/16

## 2016-12-31 NOTE — Addendum Note (Signed)
Addended by: Javier Glazier on: 06-28-202018 01:24 PM   Modules accepted: Orders

## 2016-12-31 NOTE — Telephone Encounter (Signed)
LABS 12/27/16 CBC: 9.6/12.4/38.1/199 MCV: 93 (normal) BMP: 145/4.6/104/28/18/1.08/101/9.3 LFT: 3.9/6.3/0.3/70/19/17

## 2016-12-31 NOTE — Patient Instructions (Addendum)
   Continue using your Prednisone 20mg  daily.  We are going to keep you on Imuran 50mg  twice daily.  Call my office if you have any new breathing problems or questions before your next appointment.  TESTS ORDERED: 1. Spirometry with bronchodilator challenge & with DLCO at next appointment 2. 6MWT with oxygen titration at next appointment 3. CBC in 4 weeks at PCP in [place]

## 2016-12-31 NOTE — Addendum Note (Signed)
Addended by: Lorretta Harp on: 10-11-202018 12:59 PM   Modules accepted: Orders

## 2017-01-10 ENCOUNTER — Telehealth: Payer: Self-pay | Admitting: Pulmonary Disease

## 2017-01-10 NOTE — Telephone Encounter (Signed)
Patient Instructions     Continue using your Prednisone 20mg  daily.  We are going to keep you on Imuran 50mg  twice daily.  Call my office if you have any new breathing problems or questions before your next appointment.   Spoke with Caryn Bee, pharmacy tech, verified rx as documented at last OV (AVS copied above), and verified with pharmacist as well.  Nothing further needed

## 2017-02-11 ENCOUNTER — Ambulatory Visit: Payer: Medicare PPO | Admitting: Emergency Medicine

## 2017-02-11 ENCOUNTER — Ambulatory Visit: Payer: Medicare PPO

## 2017-03-18 ENCOUNTER — Telehealth: Payer: Self-pay | Admitting: Emergency Medicine

## 2017-03-18 NOTE — Telephone Encounter (Signed)
azaTHIOprine (IMURAN) 50 MG tablet [[id number]]  Order Details  Dose: 50 mg Route: Oral Frequency: 2 times daily  Indications of Use: Rheumatoid Arthritis  Dispense Quantity: 60 tablet Refills: 3 Fills remaining: --        Sig: Take 1 tablet (50 mg total) by mouth 2 (two) times daily.       Written Date: 12/31/16 Expiration Date: 12/31/17    Start Date: 12/31/16 End Date: --       Spoke with pt, he states the pharmacy said we cancelled his prescription but I don't see any correspondence on this and that he had an appointment with [facility] on 1/23 so this can be discussed. He was a a JN pt, pt agreed and nothing further is needed.

## 2017-03-26 ENCOUNTER — Ambulatory Visit: Payer: Medicare PPO

## 2017-03-26 ENCOUNTER — Ambulatory Visit: Payer: Medicare PPO | Admitting: Emergency Medicine

## 2017-03-26 DIAGNOSIS — J849 Interstitial pulmonary disease, unspecified: Secondary | ICD-10-CM | POA: Diagnosis not present

## 2017-03-26 DIAGNOSIS — J9611 Chronic respiratory failure with hypoxia: Secondary | ICD-10-CM | POA: Diagnosis not present

## 2017-04-26 DIAGNOSIS — J849 Interstitial pulmonary disease, unspecified: Secondary | ICD-10-CM | POA: Diagnosis not present

## 2017-04-26 DIAGNOSIS — J9611 Chronic respiratory failure with hypoxia: Secondary | ICD-10-CM | POA: Diagnosis not present

## 2017-05-24 DIAGNOSIS — J849 Interstitial pulmonary disease, unspecified: Secondary | ICD-10-CM | POA: Diagnosis not present

## 2017-05-24 DIAGNOSIS — J9611 Chronic respiratory failure with hypoxia: Secondary | ICD-10-CM | POA: Diagnosis not present

## 2017-06-02 IMAGING — CT CT CHEST HIGH RESOLUTION W/O CM
2 of 6 series · 15 of 36 positions shown, 18 images · non-contrast
Comparison: 12/31/2013.

CLINICAL DATA: Worsening shortness of breath over the last 6
months, and pulmonary fibrosis. Uses oxygen at night.

EXAM:
CT CHEST WITHOUT CONTRAST
TECHNIQUE: Multidetector CT imaging of the chest was performed following the
standard protocol without intravenous contrast. High resolution
imaging of the lungs, as well as inspiratory and expiratory imaging,
was performed.

[Series 4: high resolution · axial · 0.66mm/px · z∈[-282,-44]mm · 12 of 135 slices shown, 15 images]
[im 8/135  mediastinal]
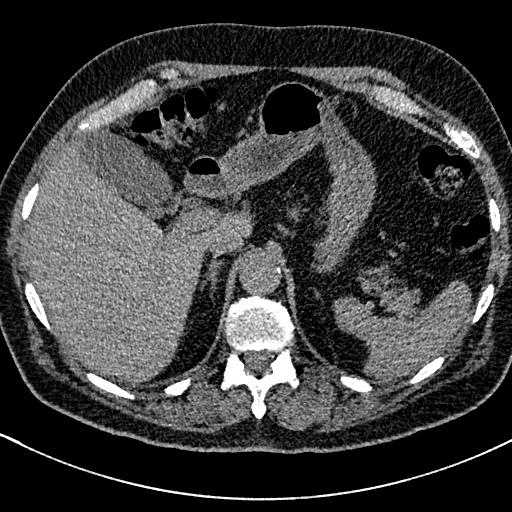
[im 8/135  lung]
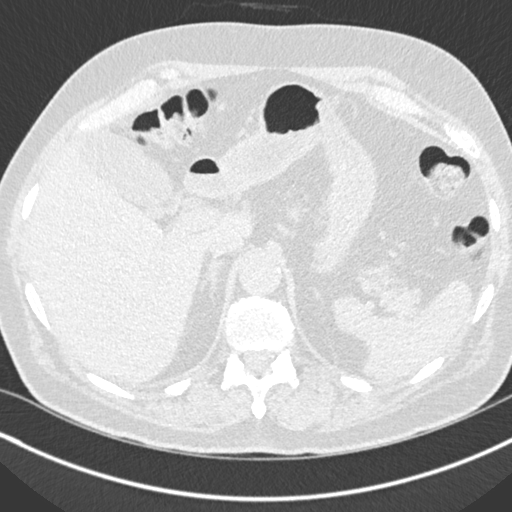
[im 22/135  lung]
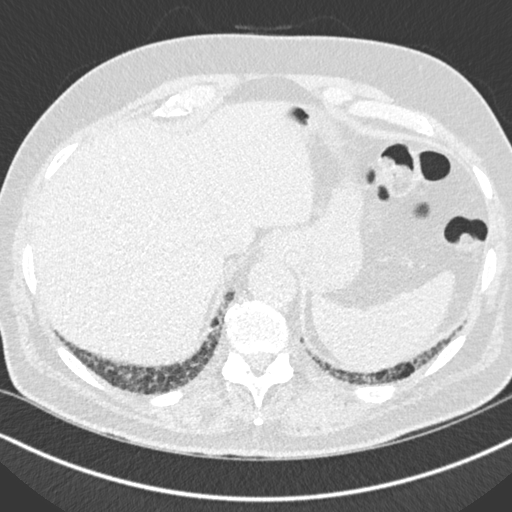
[im 29/135  lung]
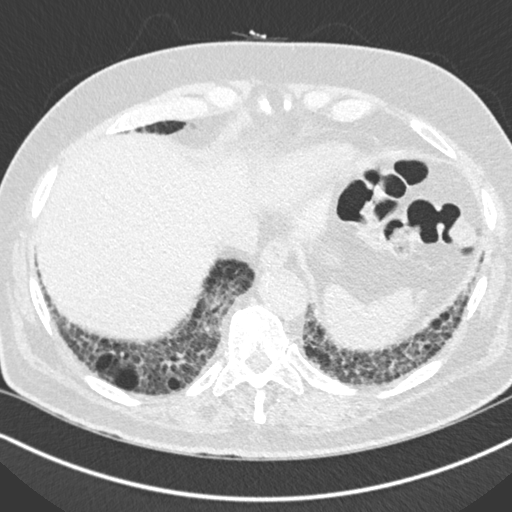
[im 43/135  lung]
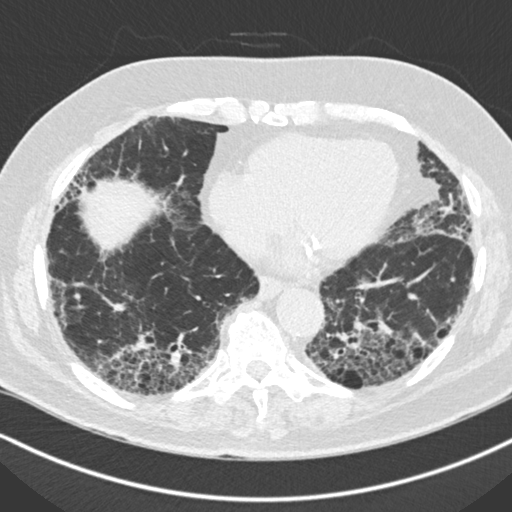
[im 50/135  mediastinal]
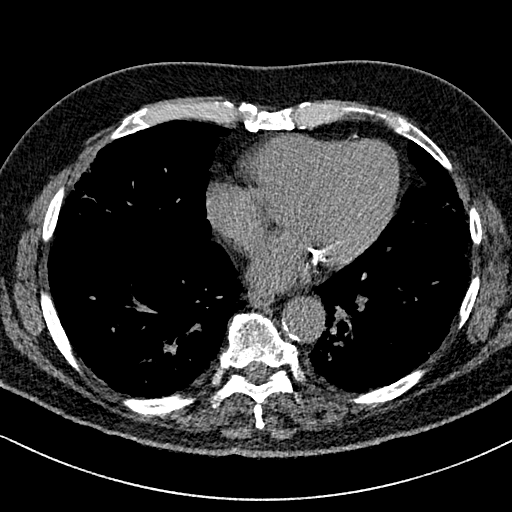
[im 50/135  lung]
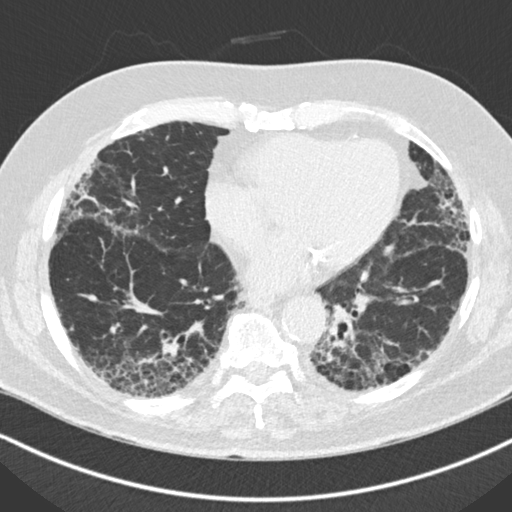
[im 64/135  lung]
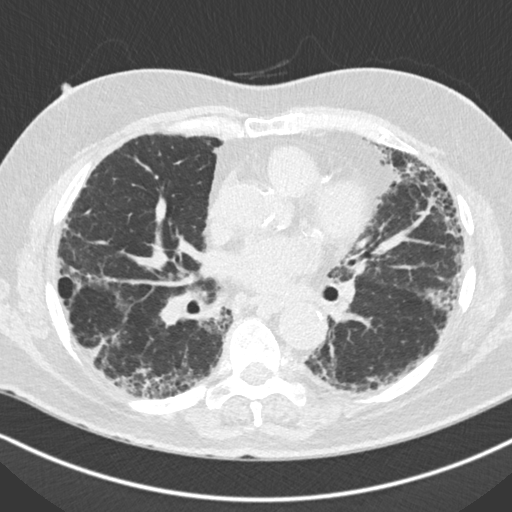
[im 71/135  lung]
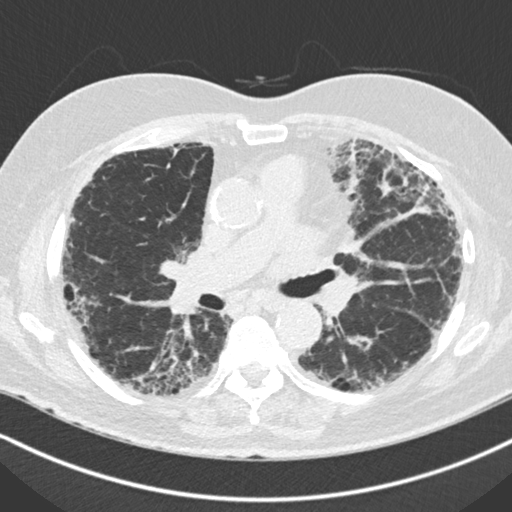
[im 85/135  lung]
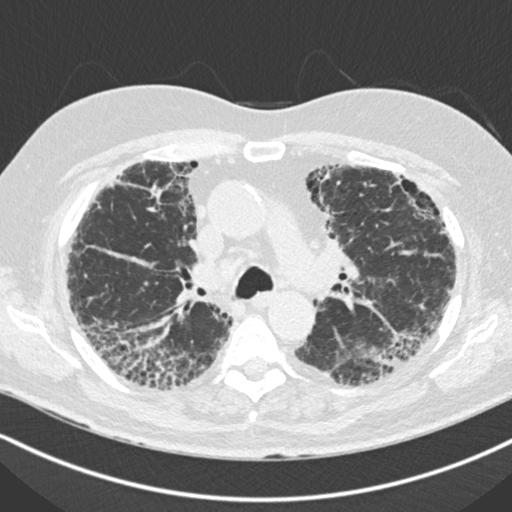
[im 92/135  mediastinal]
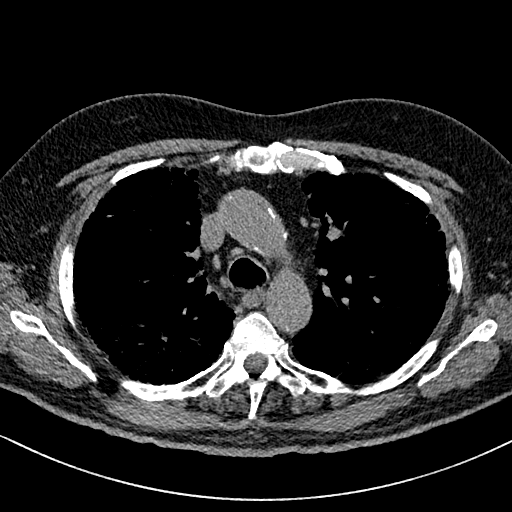
[im 92/135  lung]
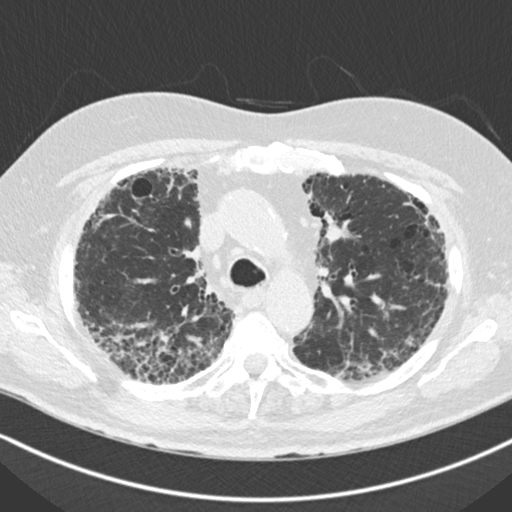
[im 106/135  lung]
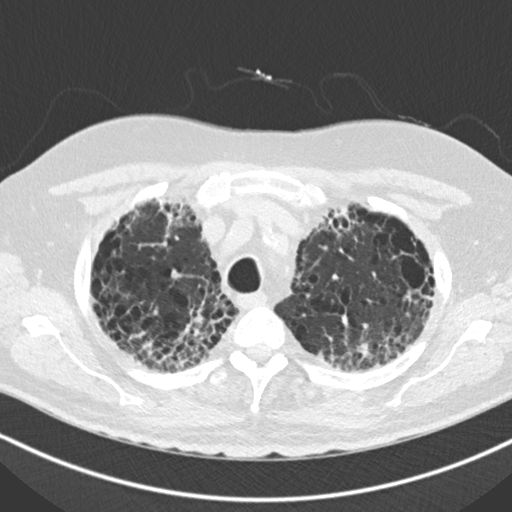
[im 113/135  lung]
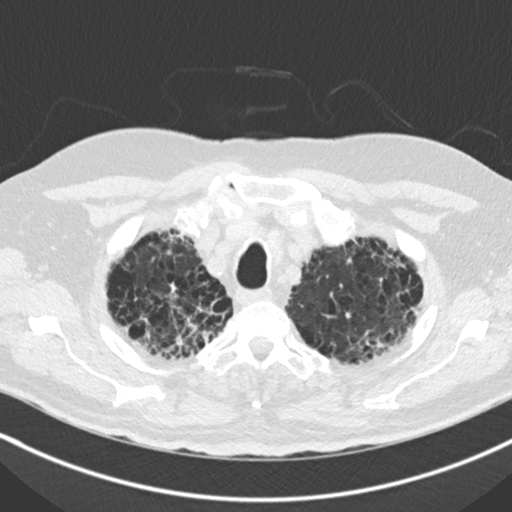
[im 127/135  lung]
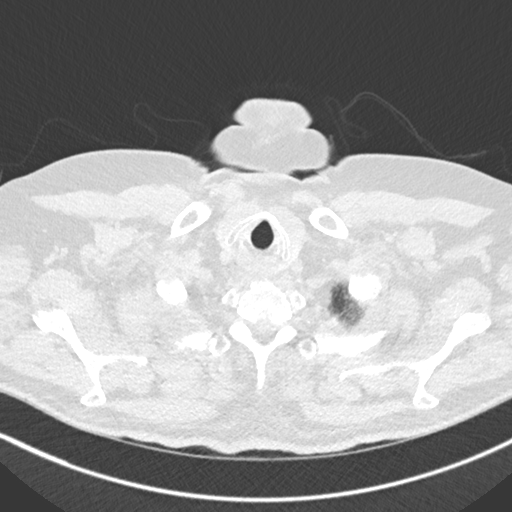

[Series 8: coronal · coronal · 0.59mm/px · 3 of 119 slices shown]
[im 24/119  lung]
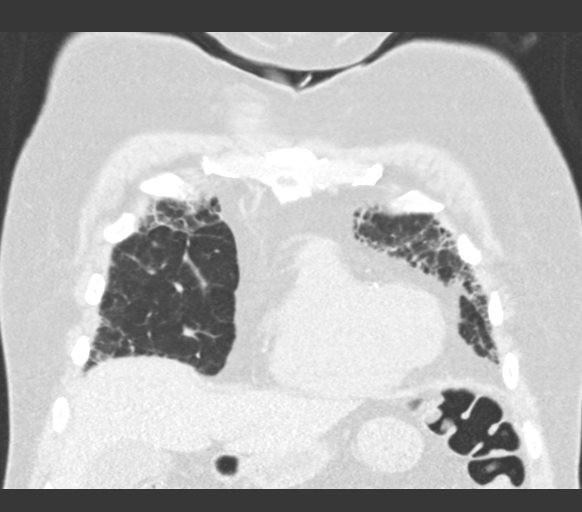
[im 48/119  lung]
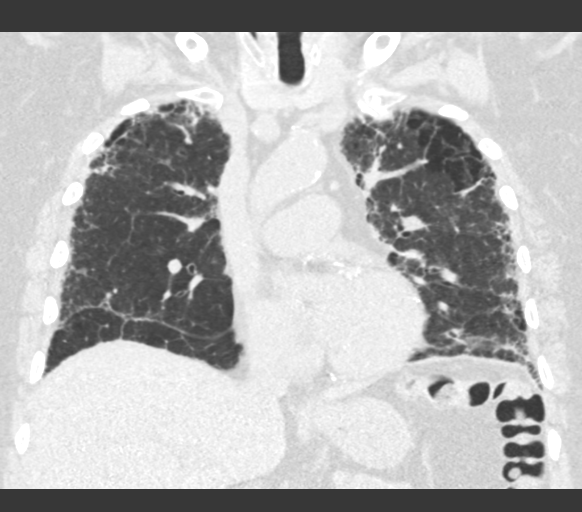
[im 71/119  lung]
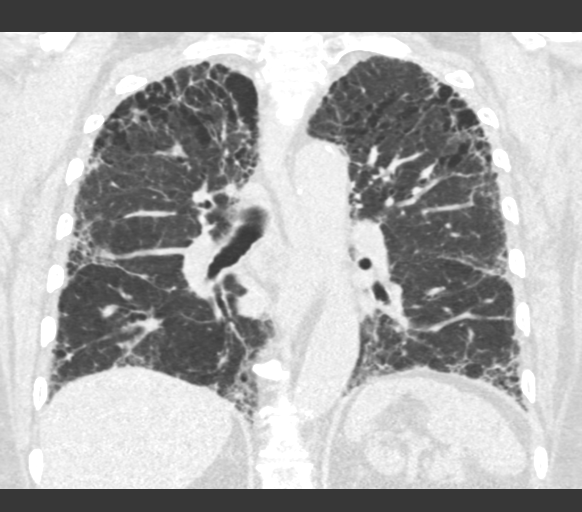

[15 of 36 positions shown; findings below may reference images not displayed]

FINDINGS: Mediastinum/Lymph Nodes: Mediastinal lymph nodes measure up to 12 mm
in the AP window, stable. Hilar regions are difficult to
definitively evaluate without IV contrast. No axillary adenopathy.
Atherosclerotic calcification of the arterial vasculature, including
three-vessel involvement of the coronary arteries. Pulmonary
arteries are enlarged. Heart size normal. No pericardial effusion.

Lungs/Pleura: Subpleural and basilar predominant subpleural
reticulation, traction bronchiectasis/ bronchiolectasis,
ground-glass and mild architectural distortion, possibly minimally
progressive from 12/31/2013. Findings superimposed on centrilobular
emphysema. No pleural fluid. Minimal adherent debris in the trachea.

Upper abdomen: Visualized portions of the liver, gallbladder,
adrenal glands, spleen, pancreas, stomach and bowel are grossly
unremarkable.

Musculoskeletal: No worrisome lytic or sclerotic lesions.
Degenerative changes are seen in the spine.
IMPRESSION: 1. Pulmonary parenchymal pattern of fibrosis appears minimally
progressive from 12/31/2013 and is most suggestive of usual
interstitial pneumonitis (UIP).
2. Three-vessel coronary artery calcification.
3. Enlarged pulmonary arteries, indicative of pulmonary arterial
hypertension.

## 2017-06-03 DIAGNOSIS — Z95828 Presence of other vascular implants and grafts: Secondary | ICD-10-CM | POA: Diagnosis not present

## 2017-06-03 DIAGNOSIS — E118 Type 2 diabetes mellitus with unspecified complications: Secondary | ICD-10-CM | POA: Diagnosis not present

## 2017-06-03 DIAGNOSIS — I2510 Atherosclerotic heart disease of native coronary artery without angina pectoris: Secondary | ICD-10-CM | POA: Diagnosis not present

## 2017-06-03 DIAGNOSIS — Z79899 Other long term (current) drug therapy: Secondary | ICD-10-CM | POA: Diagnosis not present

## 2017-06-03 DIAGNOSIS — I50810 Right heart failure, unspecified: Secondary | ICD-10-CM | POA: Diagnosis not present

## 2017-06-03 DIAGNOSIS — E559 Vitamin D deficiency, unspecified: Secondary | ICD-10-CM | POA: Diagnosis not present

## 2017-06-03 DIAGNOSIS — J8410 Pulmonary fibrosis, unspecified: Secondary | ICD-10-CM | POA: Diagnosis not present

## 2017-06-03 DIAGNOSIS — M069 Rheumatoid arthritis, unspecified: Secondary | ICD-10-CM | POA: Diagnosis not present

## 2017-06-03 DIAGNOSIS — E785 Hyperlipidemia, unspecified: Secondary | ICD-10-CM | POA: Diagnosis not present

## 2017-06-04 DIAGNOSIS — E118 Type 2 diabetes mellitus with unspecified complications: Secondary | ICD-10-CM | POA: Diagnosis not present

## 2017-06-09 DIAGNOSIS — I350 Nonrheumatic aortic (valve) stenosis: Secondary | ICD-10-CM | POA: Diagnosis not present

## 2017-06-09 DIAGNOSIS — J8410 Pulmonary fibrosis, unspecified: Secondary | ICD-10-CM | POA: Diagnosis not present

## 2017-06-09 DIAGNOSIS — E7800 Pure hypercholesterolemia, unspecified: Secondary | ICD-10-CM | POA: Diagnosis not present

## 2017-06-09 DIAGNOSIS — I2510 Atherosclerotic heart disease of native coronary artery without angina pectoris: Secondary | ICD-10-CM | POA: Diagnosis not present

## 2017-06-09 DIAGNOSIS — I714 Abdominal aortic aneurysm, without rupture: Secondary | ICD-10-CM | POA: Diagnosis not present

## 2017-06-23 ENCOUNTER — Telehealth: Payer: Self-pay | Admitting: Emergency Medicine

## 2017-06-23 MED ORDER — AZATHIOPRINE 50 MG PO TABS: 50.0000 mg | ORAL_TABLET | Freq: Two times a day (BID) | ORAL | 0 refills | Status: DC

## 2017-06-23 NOTE — Telephone Encounter (Signed)
Refill on Imuran. Last OV on 11/18/2016 with Dr. Ashok Cordia. Refilled for 90 days since he has an appt with RB 07/02/2017. Nothing further is needed.

## 2017-06-24 DIAGNOSIS — J9611 Chronic respiratory failure with hypoxia: Secondary | ICD-10-CM | POA: Diagnosis not present

## 2017-06-24 DIAGNOSIS — J849 Interstitial pulmonary disease, unspecified: Secondary | ICD-10-CM | POA: Diagnosis not present

## 2017-06-26 ENCOUNTER — Ambulatory Visit (INDEPENDENT_AMBULATORY_CARE_PROVIDER_SITE_OTHER): Payer: Medicare HMO | Admitting: *Deleted

## 2017-06-26 DIAGNOSIS — J9611 Chronic respiratory failure with hypoxia: Secondary | ICD-10-CM

## 2017-06-26 DIAGNOSIS — J849 Interstitial pulmonary disease, unspecified: Secondary | ICD-10-CM

## 2017-06-26 NOTE — Progress Notes (Signed)
SIX MIN WALK 06/26/2017 11/18/2016 08/30/2016 07/18/2016 05/22/2016 11/20/2015 07/17/2015  Medications pt took all medication at 9am this morning prior to 68min walk- Tylenol 325mg , lipitor 80mg , imuran 50mg , cyanocobalamin 1075mc, drisdol 50000, lasix 40 mg, toprol 25mg , prilosec 20mg , potassium chloride 20mg , and deltasone 10mg . allopurinol 100mg , lipitor 80mg , vitamin b12 and D, lasix 40mg ,metoprolol 25mg , omeprazole 20mg , and prednisone 10mg . Patient unable to remember what time.  None cellcept 500mg  and prednisone 40mg  taken at 8:00am - NONE Tylenol 325mg , Zyloprim 100mg , ASA 81, Calcium, Vit B12, Cellcept 500mg , Prilosec 20mg , Pravachol 80mg  and Prednisone 10mg  -- ALL @ 9am  Supplimental Oxygen during Test? (L/min) Yes No Yes Yes No No No  O2 Flow Rate 2 - 5 3 - - -  Type Continuous - Continuous Continuous - - -  Laps 2 2 3 3  - 2 4  Partial Lap (in Meters) 0 18 24 24  - 0 0  Baseline BP (sitting) 122/78 120/74 120/78 128/68 - 114/72 132/86  Baseline Heartrate 59 75 64 80 - 110 90  Baseline Dyspnea (Borg Scale) 2 2 3  0 - 3 3  Baseline Fatigue (Borg Scale) 0 1 0.5 0 - 1 0.5  Baseline SPO2 90 100 97 94 - 95 95  BP (sitting) 138/84 128/76 130/82 142/80 - 128/68 144/82  Heartrate 87 104 118 121 - 125 131  Dyspnea (Borg Scale) 8 2 4 4  - 4 5  Fatigue (Borg Scale) 5 3 2  0 - 4 3  SPO2 84 93 92 94 - 83 82  BP (sitting) 120/76 126/76 128/80 136/78 - 110/66 128/72  Heartrate 52 71 85 85 - 102 103  SPO2 97 97 100 100 - 95 94  Stopped or Paused before Six Minutes Yes Yes Yes Yes - Yes No  Other Symptoms at end of Exercise pt reports knee pain severe, SOB on 1st lap had to stop one and half minutes due to being very SOB with exertion.  Stopped with 220 left. O2 was at 89%, HR:119. Increased SOB.  Stopped at 5:03 due to SOB, 86% 109p, placed on 5L of o2. Stopped again at 2:53 due to SOB, remained on 5L of o2, 93% 116p. Patient completed walk with the use of a walker. Walked a slow pace.  apply O2.  - Pt walked  only 2 laps and took 2 long breaks during the test d/t SOB and chest tightness -  Interpretation Dizziness;Leg pain - Leg pain Leg pain - - Leg pain  Distance Completed 96 114 168 168 - 96 192  Tech Comments: - Patient completed walk with 2L of O2. He walked at his normal pace and with a walker. He had to stop with 220 left due to SOB. Denied any other symptoms. Pt was able to resume test.  - pt started test on roomair, test paused with 4:11 left of test due to pt's O2 dropping to 86 pt was placed on 2L and test resumed. pt O2 dropped to 87 with 2:28 left test was paused at 66 meters and O2 was increased to 3L. test was pefromed with forehead prob, pt did walk with walker. pt c/o leg pain but states this is normal with this amount of exertion.  after 30 secs, sats dropped to 84% ra--increased to 90% on 2lpm pulsed o2 and then steadily dropped to 80% just standing--increased to 5lpm pulsed and sats increased to 96%, walked until 2:51 min remaing and stopped due to knee pain- sats dropped from 90% to 81% on the  5lpm pulsed while resting- pt placed on 6lpm cont o2 and walked until 30 secs remaing and mainted sat above 90%.  Pt walked 2 laps, took 2 long breaks d/t SOB and chest tightness. Pt's O2 was 83% upon stopping 6MW and took the 2 minutes for the O2 to recover. Pt was then titrated on O2 for 6 min - Pt required 2 liters O2 to maintain O2 above 90% for his level of exertion. Pt again took 2 long breaks during O2 Titration, not allowing his O2 to drop very low, lowest O2 sat was 88%. O2 was turned on to 2 liters at this this point and pt completed 1 lap with O2 on and maintained levels above 90%.  Pt completed full 6MW, no pauses, c/o chest tightness and leg pain which slowed his pace. Pt was titrated on 2Liters O2 at end of test d/t desat (see Patient Care Coordination notes) - pt's O2 controlled with 2 Liters O2 - Pt states that this was a lot of walking compared to his normal activites and amount he walks  daily. Pt denied any chest tightness while doing the O2 titration --AMG

## 2017-06-30 ENCOUNTER — Telehealth: Payer: Self-pay | Admitting: Emergency Medicine

## 2017-06-30 NOTE — Telephone Encounter (Signed)
Called [employer] and spoke with Adrianna. I have given her the verbal to have this taken care of. Nothing further was needed.

## 2017-06-30 NOTE — Telephone Encounter (Signed)
Still OK to fill. Thanks.

## 2017-06-30 NOTE — Telephone Encounter (Signed)
Called and spoke with Montara. She states that the medications allopurinol and Imuran are a sensitivity interaction. They wanted to make sure that RB was aware and that it is ok to still fill.    RB please advise, thanks.

## 2017-07-01 ENCOUNTER — Encounter: Payer: Self-pay | Admitting: Vascular Surgery

## 2017-07-01 ENCOUNTER — Ambulatory Visit (HOSPITAL_COMMUNITY)
Admission: RE | Admit: 2017-07-01 | Discharge: 2017-07-01 | Disposition: A | Discharge status: Home / Self Care | Payer: Medicare HMO | Source: Ambulatory Visit | Attending: Vascular Surgery | Admitting: Vascular Surgery

## 2017-07-01 ENCOUNTER — Ambulatory Visit: Payer: Medicare HMO | Admitting: Vascular Surgery

## 2017-07-01 VITALS — BP 116/62 | HR 58 | Temp 97.8°F | Resp 20 | Ht 66.0 in | Wt 194.0 lb

## 2017-07-01 DIAGNOSIS — I714 Abdominal aortic aneurysm, without rupture, unspecified: Secondary | ICD-10-CM

## 2017-07-01 NOTE — Progress Notes (Signed)
[facility] [place]  Patient name: Jeff Lynch MRN: [id number] DOB: 1940-04-13 Sex: male  REASON FOR VISIT: [place] up stent graft repair abdominal aortic aneurysm August 2018  HPI: Jeff Lynch is a 77 y.o. male here today for follow-up.  He does have significant pulmonary disease and is limited mainly by his shortness of breath with exertion.  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  He walks with the aid of a rolling walker.  He has no symptoms referable to his aneurysm  Past Medical History:  Diagnosis Date  . Acute pain of right shoulder   . Aortic stenosis    mild-moderate AS 07/2016   . Cervical pain   . Chronic respiratory failure with hypoxia ([place])   . Coronary artery disease   . Cough   . Dyspnea   . Esophagitis, reflux   . GERD (gastroesophageal reflux disease)    ocassionally  . Gout   . Hyperlipemia   . ILD (interstitial lung disease) ([place])    Likely Secondary to RA  . Impotence, organic   . Knee joint pain   . Prostate cancer ([place])   . Rheumatoid arthritis ([place])   . SOB (shortness of breath)   . Vitamin B12 deficiency   . Vitamin D deficiency     Family History  Problem Relation Age of Onset  . Liver disease Father   . Prostate cancer Brother   . Lung disease Neg Hx   . Rheumatologic disease Neg Hx     SOCIAL HISTORY: Social History   Tobacco Use  . Smoking status: Former Smoker    Packs/day: 1.00    Years: 40.00    Pack years: 40.00    Types: Cigarettes    Last attempt to quit: 01/02/2002    Years since quitting: 15.5  . Smokeless tobacco: Never Used  Substance Use Topics  . Alcohol use: No    Alcohol/week: 0.0 oz    No Known Allergies  Current Outpatient Medications  Medication Sig Dispense Refill  . acetaminophen (TYLENOL) 325 MG tablet Take 325 mg by mouth every 6 (six) hours as needed. Reported on 05/10/2015    . allopurinol  (ZYLOPRIM) 100 MG tablet Take 1 tablet by mouth daily.    [name] aspirin 81 MG tablet Take 81 mg by mouth daily.    [name] atorvastatin (LIPITOR) 80 MG tablet Take 80 mg by mouth daily.    [name] azaTHIOprine (IMURAN) 50 MG tablet Take 1 tablet (50 mg total) by mouth 2 (two) times daily. 180 tablet 0  . CALCIUM PO Take 1 tablet by mouth 2 (two) times daily.     . furosemide (LASIX) 40 MG tablet Take 40 mg by mouth daily.     . isosorbide mononitrate (IMDUR) 60 MG 24 hr tablet Take 60 mg by mouth daily.    . metoprolol succinate (TOPROL-XL) 25 MG 24 hr tablet Take 2 tablets by mouth daily.    . nitroGLYCERIN (NITROSTAT) 0.4 MG SL tablet Place 0.4 mg under the tongue every 5 (five) minutes as needed for chest pain.     [name] omeprazole (PRILOSEC) 20 MG capsule Take 1 capsule (20 mg total) by mouth daily. (Patient taking differently: Take 20 mg by mouth daily as needed. As needed) 30 capsule 6  . potassium chloride SA (K-DUR,KLOR-CON) 20 MEQ tablet Take 20 mEq by mouth daily.  1  . predniSONE (DELTASONE) 10 MG tablet  Take 30 mg by mouth daily.    . vitamin B-12 (CYANOCOBALAMIN) 1000 MCG tablet Take 1 tablet by mouth daily.    . Vitamin D, Ergocalciferol, (DRISDOL) 50000 UNITS CAPS capsule Take 50,000 Units by mouth every 7 (seven) days. Reported on 08/07/2015     No current facility-administered medications for this visit.     REVIEW OF SYSTEMS:  [X]  denotes positive finding, [ ]  denotes negative finding Cardiac  Comments:  Chest pain or chest pressure:    Shortness of breath upon exertion: x   Short of breath when lying flat:    Irregular heart rhythm:        Vascular    Pain in calf, thigh, or hip brought on by ambulation:    Pain in feet at night that wakes you up from your sleep:     Blood clot in your veins:    Leg swelling:  x         PHYSICAL EXAM: Vitals:   07/01/17 1007  BP: 116/62  Pulse: (!) 58  Resp: 20  Temp: 97.8 F (36.6 C)  TempSrc: Oral  SpO2: 97%  Weight: 194 lb (88 kg)    Height: 5\' 6"  (1.676 m)    GENERAL: The patient is a well-nourished male, in no acute distress. The vital signs are documented above. CARDIOVASCULAR: Carotid arteries without bruits bilaterally.  Palpable radial and popliteal arteries bilaterally.  Abdomen soft with no bruits noted PULMONARY: There is good air exchange  MUSCULOSKELETAL: There are no major deformities or cyanosis. NEUROLOGIC: No focal weakness or paresthesias are detected. SKIN: There are no ulcers or rashes noted. PSYCHIATRIC: The patient has a normal affect.  DATA:  Ultrasound today shows maximal aortic diameter of 5.4 cm which is decreased from his most recent CT scan September 2018 which was 6.0 cm  MEDICAL ISSUES: Stable follow-up with stent graft repair.  Will be seen again on a yearly basis with ultrasound to rule out any progression in size of his aneurysm.  We will see nurse practitioner at next visit    Rosetta Posner, MD [facility] Tel [phone] Pager [phone]

## 2017-07-02 ENCOUNTER — Ambulatory Visit: Payer: Medicare PPO | Admitting: Emergency Medicine

## 2017-07-02 ENCOUNTER — Other Ambulatory Visit (INDEPENDENT_AMBULATORY_CARE_PROVIDER_SITE_OTHER): Payer: Medicare HMO

## 2017-07-02 ENCOUNTER — Ambulatory Visit (INDEPENDENT_AMBULATORY_CARE_PROVIDER_SITE_OTHER): Payer: Medicare HMO | Admitting: Emergency Medicine

## 2017-07-02 ENCOUNTER — Encounter: Payer: Self-pay | Admitting: Emergency Medicine

## 2017-07-02 VITALS — BP 118/76 | HR 60 | Ht 66.0 in | Wt 194.0 lb

## 2017-07-02 DIAGNOSIS — J439 Emphysema, unspecified: Secondary | ICD-10-CM

## 2017-07-02 DIAGNOSIS — J9611 Chronic respiratory failure with hypoxia: Secondary | ICD-10-CM

## 2017-07-02 DIAGNOSIS — J849 Interstitial pulmonary disease, unspecified: Secondary | ICD-10-CM

## 2017-07-02 DIAGNOSIS — J8410 Pulmonary fibrosis, unspecified: Secondary | ICD-10-CM | POA: Diagnosis not present

## 2017-07-02 DIAGNOSIS — Z5181 Encounter for therapeutic drug level monitoring: Secondary | ICD-10-CM

## 2017-07-02 LAB — PULMONARY FUNCTION TEST
DL/VA % pred: 72 %
DL/VA: 3.15 ml/min/mmHg/L
DLCO unc % pred: 33 %
DLCO unc: 8.99 ml/min/mmHg
FEF 25-75 Post: 1.98 L/sec
FEF 25-75 Pre: 1.78 L/sec
FEF2575-%Change-Post: 11 %
FEF2575-%Pred-Post: 110 %
FEF2575-%Pred-Pre: 99 %
FEV1-%Change-Post: 0 %
FEV1-%Pred-Post: 63 %
FEV1-%Pred-Pre: 63 %
FEV1-Post: 1.59 L
FEV1-Pre: 1.6 L
FEV1FVC-%Change-Post: 1 %
FEV1FVC-%Pred-Pre: 116 %
FEV6-%Change-Post: -2 %
FEV6-%Pred-Post: 56 %
FEV6-%Pred-Pre: 57 %
FEV6-Post: 1.86 L
FEV6-Pre: 1.9 L
FEV6FVC-%Change-Post: 0 %
FEV6FVC-%Pred-Post: 107 %
FEV6FVC-%Pred-Pre: 107 %
FVC-%Change-Post: -2 %
FVC-%Pred-Post: 52 %
FVC-%Pred-Pre: 53 %
FVC-Post: 1.86 L
FVC-Pre: 1.9 L
Post FEV1/FVC ratio: 85 %
Post FEV6/FVC ratio: 100 %
Pre FEV1/FVC ratio: 84 %
Pre FEV6/FVC Ratio: 100 %

## 2017-07-02 LAB — CBC WITH DIFFERENTIAL/PLATELET
Basophils Absolute: 0 10*3/uL (ref 0.0–0.1)
Basophils Relative: 0.1 % (ref 0.0–3.0)
Eosinophils Absolute: 0 10*3/uL (ref 0.0–0.7)
Eosinophils Relative: 0.1 % (ref 0.0–5.0)
HEMATOCRIT: 41.3 % (ref 39.0–52.0)
HEMOGLOBIN: 13.9 g/dL (ref 13.0–17.0)
LYMPHS PCT: 5.6 % — AB (ref 12.0–46.0)
Lymphs Abs: 0.6 10*3/uL — ABNORMAL LOW (ref 0.7–4.0)
MCHC: 33.6 g/dL (ref 30.0–36.0)
MCV: 91.8 fl (ref 78.0–100.0)
Monocytes Absolute: 0.8 10*3/uL (ref 0.1–1.0)
Monocytes Relative: 7.6 % (ref 3.0–12.0)
Neutro Abs: 8.9 10*3/uL — ABNORMAL HIGH (ref 1.4–7.7)
Neutrophils Relative %: 86.6 % — ABNORMAL HIGH (ref 43.0–77.0)
Platelets: 191 10*3/uL (ref 150.0–400.0)
RBC: 4.5 Mil/uL (ref 4.22–5.81)
RDW: 19.5 % — AB (ref 11.5–15.5)
WBC: 10.2 10*3/uL (ref 4.0–10.5)

## 2017-07-02 MED ORDER — TIOTROPIUM BROMIDE-OLODATEROL 2.5-2.5 MCG/ACT IN AERS
2.0000 | INHALATION_SPRAY | Freq: Every day | RESPIRATORY_TRACT | 0 refills | Status: AC
Start: 2017-07-02 — End: ?

## 2017-07-02 NOTE — Progress Notes (Signed)
Subjective:    Patient ID: Jeff Lynch, male    DOB: 08-Nov-1940, 77 y.o.   MRN: [id number]  C.C.:  Follow-up for ILD, Immunosuppressed Status, Chronic Hypoxic Respiratory Failure, Pulmonary Emphysema, & GERD.  HPI 77 year old gentleman with rheumatoid arthritis and history of tobacco use/COPD.  He has been followed by Dr. 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  He has been managed with Imuran 50 mg twice a day and prednisone 20 mg daily.  He is on Bactrim 3 times a week for prophylaxis.  His most recent CT scan of the chest was done on 09/06/2016 and showed stable interstitial disease with some consolidative and nodular components inferiorly superimposed on emphysema.  He wears oxygen at 2-3 L/min. He believes that he has had some increased exertional SOB, happens at shorter distances. He reports that he underwent a pred burst in the last year, now back to 68m - has been on 17mat times in the past. He is not currently on any BD's. He did try them several years ago, not sure they benefited him.   Pulmonary function testing done today was reviewed by me.  This shows restricted spirometry with some probable superimposed obstruction based on a normal FEV1 to FVC ratio, no significant bronchodilator response, decreased diffusion capacity that corrects to 72% of predicted when adjusted for his alveolar volume.  His FEV1 has decreased slightly to 1.6 L from 1.97 L 07/18/2016, 1.86 L 05/22/2016.  Review of Systems   Increased exertional dyspnea as above.   No Known Allergies  Current Outpatient Medications on File Prior to Visit  Medication Sig Dispense Refill  . acetaminophen (TYLENOL) 325 MG tablet Take 325 mg by mouth every 6 (six) hours as needed. Reported on 05/10/2015    . allopurinol (ZYLOPRIM) 100 MG tablet Take 1 tablet by mouth daily.    . Marland Kitchenspirin 81 MG tablet Take 81 mg by mouth daily.    . Marland Kitchentorvastatin (LIPITOR) 80 MG tablet Take  80 mg by mouth daily.    . Marland KitchenzaTHIOprine (IMURAN) 50 MG tablet Take 1 tablet (50 mg total) by mouth 2 (two) times daily. 180 tablet 0  . CALCIUM PO Take 1 tablet by mouth 2 (two) times daily.     . furosemide (LASIX) 40 MG tablet Take 40 mg by mouth daily.     . isosorbide mononitrate (IMDUR) 60 MG 24 hr tablet Take 60 mg by mouth daily.    . metoprolol succinate (TOPROL-XL) 25 MG 24 hr tablet Take 2 tablets by mouth daily.    . nitroGLYCERIN (NITROSTAT) 0.4 MG SL tablet Place 0.4 mg under the tongue every 5 (five) minutes as needed for chest pain.     . Marland Kitchenmeprazole (PRILOSEC) 20 MG capsule Take 1 capsule (20 mg total) by mouth daily. (Patient taking differently: Take 20 mg by mouth daily as needed. As needed) 30 capsule 6  . potassium chloride SA (K-DUR,KLOR-CON) 20 MEQ tablet Take 20 mEq by mouth daily.  1  . predniSONE (DELTASONE) 10 MG tablet Take 30 mg by mouth daily.    . vitamin B-12 (CYANOCOBALAMIN) 1000 MCG tablet Take 1 tablet by mouth daily.    . Vitamin D, Ergocalciferol, (DRISDOL) 50000 UNITS CAPS capsule Take 50,000 Units by mouth every 7 (seven) days. Reported on 08/07/2015     No current facility-administered medications on file prior to visit.     Past Medical History:  Diagnosis Date  .  Acute pain of right shoulder   . Aortic stenosis    mild-moderate AS 07/2016   . Cervical pain   . Chronic respiratory failure with hypoxia ([place])   . Coronary artery disease   . Cough   . Dyspnea   . Esophagitis, reflux   . GERD (gastroesophageal reflux disease)    ocassionally  . Gout   . Hyperlipemia   . ILD (interstitial lung disease) ([place])    Likely Secondary to RA  . Impotence, organic   . Knee joint pain   . Prostate cancer ([place])   . Rheumatoid arthritis ([place])   . SOB (shortness of breath)   . Vitamin B12 deficiency   . Vitamin D deficiency     Past Surgical History:  Procedure Laterality Date  . ABDOMINAL AORTIC ENDOVASCULAR STENT GRAFT N/A 10/02/2016   Procedure:  ABDOMINAL AORTIC ENDOVASCULAR STENT GRAFT/GORE;  Surgeon: Rosetta Posner, MD;  Location: [place];  Service: Vascular;  Laterality: N/A;  . BACK SURGERY  1990  . CARDIAC CATHETERIZATION     07/19/16 [facility]: 30% LM, 90% pLAD, 30% OM1, 60% mCx, 30% OM2, 50% pRCA. EF 75%. Medical tx. Consider PCI if failed med tx. Not a CABG candidate.  . CYSTOSCOPY N/A 10/02/2016   Procedure: CYSTOSCOPY;  Surgeon: Bjorn Loser, MD;  Location: [place];  Service: Urology;  Laterality: N/A;  . HERNIA REPAIR     1960's  . PROSTATECTOMY  2003    Family History  Problem Relation Age of Onset  . Liver disease Father   . Prostate cancer Brother   . Lung disease Neg Hx   . Rheumatologic disease Neg Hx     Social History   Socioeconomic History  . Marital status: Single    Spouse name: Not on file  . Number of children: Not on file  . Years of education: Not on file  . Highest education level: Not on file  Occupational History  . Occupation: [employer]    Comment: Stocking  . 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 Financial resource strain: Not on file  . Food insecurity:    Worry: Not on file    Inability: Not on file  . Transportation needs:    Medical: Not on file    Non-medical: Not on file  Tobacco Use  . Smoking status: Former Smoker    Packs/day: 1.00    Years: 40.00    Pack years: 40.00    Types: Cigarettes    Last attempt to quit: 01/02/2002    Years since quitting: 15.5  . Smokeless tobacco: Never Used  Substance and Sexual Activity  . Alcohol use: No    Alcohol/week: 0.0 oz  . Drug use: No  . Sexual activity: Not on file  Lifestyle  . Physical activity:    Days per week: Not on file    Minutes per session: Not on file  . Stress: Not on file  Relationships  . Social connections:    Talks on phone: Not on file    Gets together: Not on file    Attends religious service: Not on file    Active member of club or organization: Not on file     Attends meetings of clubs or organizations: Not on file    Relationship status: Not on file  Other Topics Concern  . Not on file  Social History Narrative   Originally from [place]. Always lived in [place]. Prior travel  to [place], [place], [place], [place], & [place]. No international travel. 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 No known asbestos exposure. Remote exposure to birds/parakeets briefly. Has a dog currently. No mold exposure.       Objective:   Physical Exam BP 118/76 (BP Location: Left Arm, Cuff Size: Normal)   Pulse 60   Ht 5' 6" (1.676 m)   Wt 88 kg (194 lb)   SpO2 95%   BMI 31.31 kg/m   Gen: Pleasant, chronically ill appearing, in no distress,  normal affect, motorized scooter  ENT: No lesions,  mouth clear,  oropharynx clear, no postnasal drip  Neck: No JVD, no stridor  Lungs: No use of accessory muscles, bilateral basilar insp crackles.   Cardiovascular: RRR, 2/6 systolic M, 1+ peripheral edema  Musculoskeletal: No deformities, no cyanosis or clubbing  Neuro: alert, non focal  Skin: Warm, no lesions or rashes     PFT 07/18/16: FVC 2.39 L (67%) FEV1 1.97 L (77%) FEV1/FVC 0.83 FEF 25-75 2.13 L (116%)                                                                                                                     DLCO corrected 37% 05/22/16: FVC 2.31 L (64%) FEV1 1.86 L (72%) FEV1/FVC 0.81 FEF 25-75 1.82 L (98%)                                                                                                                      DLCO corrected 35% 02/20/16: FVC 2.57 L (71%) FEV1 2.14 L (83%) FEV1/FVC 0.83 FEF 25-75 2.36 L (126%)                                                                                                                    DLCO corrected 43% (Hgb 14.3) 11/20/15: FVC 2.55 L (71%) FEV1 2.13 L (82%) FEV1/FVC 0.84 FEF 25-75 2.40 L (128%)  DLCO corrected 37% (Hgb 15.7) 07/17/15: FVC 2.65 L (73%) FEV1 2.18 L (84%) FEV1/FVC 0.83 FEF 25-75 2.42 L (128%) no bronchodilator response TLC 4.09 L (65%) RV 56% ERV 63% DLCO corrected 41% (Hgb 16.3)  6MWT 11/18/16:  Walked 114 meters / Baseline Sat 100% on 2 L/m / Nadir Sat 89% on 2 L/m at 3:40 (required 2 L/m to maintain with exertion; stopped at 3:40 w/ shortness of breath) 08/30/16:  Walked 168 meters / Baseline Sat 97% on RA / Nadir Sat 86% on RA @ 5:03 (requrired 5 L/m to maintain) 07/18/16:  Walked 168 meters / Baseline Sat 94% on RA / Nadir Sat 86% on RA @ 4:11 (required 3 L/m with exertion to maintain) 05/22/16:  Baseline Sat 84% on RA / Required 2 L/m pulse dose at rest / Requred 6 L/m continuous flow with exertion to maintain Sat >90% 11/20/15:  96 meters / Baseline Sat 95% on RA / Nadir Sat 83% on RA (required 2 L/m to maintain saturation & took multiple breaks during the walk with pain) 07/17/15: 192 meters / Baseline Sat 95% on RA / Nadir Sat 82% on RA at End of Walk (required 2 L/m to maintain w/ exertion) 08/23/14:  Nadir saturation 86% on RA - Saturated 93% on 2 L/m continuous  IMAGING HRCT CHEST W/O 09/06/16 (previously reviewed by me):  Predominantly UIP pattern without significant progression. Centrilobular and paraseptal emphysema noted with some apical predominance but subpleural bleb formation is also noted within the lower lung zones. No pleural effusion or thickening. No pericardial effusion. No pathologic mediastinal adenopathy.  CXR PA/LAT 03/22/16 (previously reviewed by me): Chronic prominent bilateral interstitial markings and reticulation. No focal opacity or mass appreciated. No pleural effusion. Heart normal in size & mediastinum normal in contour.  HRCT CHEST W/O 06/26/15 (previously reviewed by me): Borderline enlarged mediastinal lymph nodes measuring up to 1.2 cm in short axis. Slightly basilar predominant subpleural reticular changes with some traction  bronchiectasis and minimal groundglass. No appreciable honeycombing changes. No other parenchymal nodule or opacity appreciated. Centrilobular emphysema is also present. No pleural effusion or thickening. No pericardial effusion.  CT CHEST W/O 12/31/13 (per radiologist):Numerous scattered lymph nodes in mediastinum including 1.2 cm short axis AP window lymph node. Coarse peripheral interstitial lung disease likely with early honeycombing. Underlying centrilobular emphysema. Nodule in left midlung. No other separate nodule identified. Thoracic spondylosis.  CARDIAC TTE (06/26/15): Mild LVH. EF 60-65%. LA mildly dilated. Mild to moderate aortic stenosis with mild regurgitation. Trivial mitral regurgitation. Left atrium mildly dilated.  LABS 12/18/16 CBC: 9.6/13.1/39.9/168  11/25/16 CBC: 11.6/13.4/40.0/200 MCV: 92 BMP: 143/4.2/101/28/19/1.08/97/10.0 LFT: 4.0/6.2/0.3/67/17/60  11/18/16 Alpha-1 antitrypsin: MM (150)  10/16/16 CBC: 16.1/13.8/41.3/279  08/23/16 CBC: 10.7/14.5/43.1/145 BMP: 139/3.9/96/26/13/1.21/125/9.1 LFT: 3.8/6.0/1.4/63/23/18  06/19/16 BUN:  29 Creatinine 1.3  11/15/15 CBC: 11.3/15.0/44.8/231 BMP: 142/4.7/100/26/19/1.07/111/10.5 LFT: 4.0/7.0/0.6/65/17/13  05/15/15 CBC: 10.6/15.7/46.5/250 BMP: 139/4.8/98/28/14/1.02/117/10.3 LFT: 4.1/7.4/0.5/69/15/13  11/16/14 BMP: 140/4.5/101/23/11/0.79/119/9.2 LFT: 3.9/6.4/0.5/56/16/13 CBC: 9.0/14.8/45.2/199  01/26/14 CK: 104 ESR: 87 ANA: Negative Anti-CCP: 231.3 RF: 503      Assessment:  Postinflammatory pulmonary fibrosis with autoimmune features  Please continue your Imuran 50 mg twice a day. We will try decreasing your prednisone to 10 mg daily. Continue your oxygen at 2 to 3 L/min as you have been using it. Continue Bactrim 3 times a week. Lab work today. >> CBC and LFT We will likely repeat your CT chest at some point in the next year Follow with Dr Lamonte Sakai in 4 months or sooner if you have any  problems.  Pulmonary emphysema (HCC) Not currently on BD's. ? Whether he might benefit. Will perform therapeutic trial.   Baltazar Apo, MD, PhD 07/02/2017, 5:29 PM [facility] [phone] or if no answer [phone]

## 2017-07-02 NOTE — Assessment & Plan Note (Signed)
Please continue your Imuran 50 mg twice a day. We will try decreasing your prednisone to 10 mg daily. Continue your oxygen at 2 to 3 L/min as you have been using it. Continue Bactrim 3 times a week. Lab work today. >> CBC and LFT We will likely repeat your CT chest at some point in the next year Follow with Dr Lamonte Sakai in 4 months or sooner if you have any problems.

## 2017-07-02 NOTE — Patient Instructions (Addendum)
Please continue your Imuran 50 mg twice a day. We will try decreasing your prednisone to 10 mg daily. Continue your oxygen at 2 to 3 L/min as you have been using it. Continue Bactrim 3 times a week. Lab work today.  We will try starting an inhaled medication called stiolto, 2 puffs once a day to see if this helps your breathing. If so then call our office so that we can order for you through your pharmacy.  We will likely repeat your CT chest at some point in the next year Follow with Dr Lamonte Sakai in 4 months or sooner if you have any problems.

## 2017-07-02 NOTE — Assessment & Plan Note (Signed)
Not currently on BD's. ? Whether he might benefit. Will perform therapeutic trial.

## 2017-07-02 NOTE — Progress Notes (Signed)
PFT completed 07/02/17

## 2017-07-24 DIAGNOSIS — J9611 Chronic respiratory failure with hypoxia: Secondary | ICD-10-CM | POA: Diagnosis not present

## 2017-07-24 DIAGNOSIS — J849 Interstitial pulmonary disease, unspecified: Secondary | ICD-10-CM | POA: Diagnosis not present

## 2017-08-24 DIAGNOSIS — J9611 Chronic respiratory failure with hypoxia: Secondary | ICD-10-CM | POA: Diagnosis not present

## 2017-08-24 DIAGNOSIS — J849 Interstitial pulmonary disease, unspecified: Secondary | ICD-10-CM | POA: Diagnosis not present

## 2017-09-02 DIAGNOSIS — E559 Vitamin D deficiency, unspecified: Secondary | ICD-10-CM | POA: Diagnosis not present

## 2017-09-02 DIAGNOSIS — Z79899 Other long term (current) drug therapy: Secondary | ICD-10-CM | POA: Diagnosis not present

## 2017-09-02 DIAGNOSIS — M069 Rheumatoid arthritis, unspecified: Secondary | ICD-10-CM | POA: Diagnosis not present

## 2017-09-02 DIAGNOSIS — E663 Overweight: Secondary | ICD-10-CM | POA: Diagnosis not present

## 2017-09-02 DIAGNOSIS — Z9181 History of falling: Secondary | ICD-10-CM | POA: Diagnosis not present

## 2017-09-02 DIAGNOSIS — Z1339 Encounter for screening examination for other mental health and behavioral disorders: Secondary | ICD-10-CM | POA: Diagnosis not present

## 2017-09-02 DIAGNOSIS — E785 Hyperlipidemia, unspecified: Secondary | ICD-10-CM | POA: Diagnosis not present

## 2017-09-02 DIAGNOSIS — I2510 Atherosclerotic heart disease of native coronary artery without angina pectoris: Secondary | ICD-10-CM | POA: Diagnosis not present

## 2017-09-02 DIAGNOSIS — E118 Type 2 diabetes mellitus with unspecified complications: Secondary | ICD-10-CM | POA: Diagnosis not present

## 2017-09-02 DIAGNOSIS — J8410 Pulmonary fibrosis, unspecified: Secondary | ICD-10-CM | POA: Diagnosis not present

## 2017-09-10 DIAGNOSIS — E7800 Pure hypercholesterolemia, unspecified: Secondary | ICD-10-CM | POA: Diagnosis not present

## 2017-09-10 DIAGNOSIS — I2510 Atherosclerotic heart disease of native coronary artery without angina pectoris: Secondary | ICD-10-CM | POA: Diagnosis not present

## 2017-09-10 DIAGNOSIS — I714 Abdominal aortic aneurysm, without rupture: Secondary | ICD-10-CM | POA: Diagnosis not present

## 2017-09-10 DIAGNOSIS — I350 Nonrheumatic aortic (valve) stenosis: Secondary | ICD-10-CM | POA: Diagnosis not present

## 2017-09-10 DIAGNOSIS — J8410 Pulmonary fibrosis, unspecified: Secondary | ICD-10-CM | POA: Diagnosis not present

## 2017-09-23 DIAGNOSIS — J9611 Chronic respiratory failure with hypoxia: Secondary | ICD-10-CM | POA: Diagnosis not present

## 2017-09-23 DIAGNOSIS — J849 Interstitial pulmonary disease, unspecified: Secondary | ICD-10-CM | POA: Diagnosis not present

## 2017-10-23 ENCOUNTER — Ambulatory Visit: Payer: Medicare HMO | Admitting: Emergency Medicine

## 2017-10-24 DIAGNOSIS — J849 Interstitial pulmonary disease, unspecified: Secondary | ICD-10-CM | POA: Diagnosis not present

## 2017-10-24 DIAGNOSIS — J9611 Chronic respiratory failure with hypoxia: Secondary | ICD-10-CM | POA: Diagnosis not present

## 2017-10-28 ENCOUNTER — Ambulatory Visit: Payer: Medicare HMO | Admitting: Emergency Medicine

## 2017-11-24 DIAGNOSIS — J849 Interstitial pulmonary disease, unspecified: Secondary | ICD-10-CM | POA: Diagnosis not present

## 2017-11-24 DIAGNOSIS — J9611 Chronic respiratory failure with hypoxia: Secondary | ICD-10-CM | POA: Diagnosis not present

## 2017-12-18 DIAGNOSIS — Z95828 Presence of other vascular implants and grafts: Secondary | ICD-10-CM | POA: Diagnosis not present

## 2017-12-18 DIAGNOSIS — J8410 Pulmonary fibrosis, unspecified: Secondary | ICD-10-CM | POA: Diagnosis not present

## 2017-12-18 DIAGNOSIS — E118 Type 2 diabetes mellitus with unspecified complications: Secondary | ICD-10-CM | POA: Diagnosis not present

## 2017-12-18 DIAGNOSIS — I50810 Right heart failure, unspecified: Secondary | ICD-10-CM | POA: Diagnosis not present

## 2017-12-18 DIAGNOSIS — Z23 Encounter for immunization: Secondary | ICD-10-CM | POA: Diagnosis not present

## 2017-12-18 DIAGNOSIS — K210 Gastro-esophageal reflux disease with esophagitis: Secondary | ICD-10-CM | POA: Diagnosis not present

## 2017-12-18 DIAGNOSIS — I2510 Atherosclerotic heart disease of native coronary artery without angina pectoris: Secondary | ICD-10-CM | POA: Diagnosis not present

## 2017-12-18 DIAGNOSIS — Z298 Encounter for other specified prophylactic measures: Secondary | ICD-10-CM | POA: Diagnosis not present

## 2017-12-18 DIAGNOSIS — E785 Hyperlipidemia, unspecified: Secondary | ICD-10-CM | POA: Diagnosis not present

## 2017-12-24 DIAGNOSIS — J849 Interstitial pulmonary disease, unspecified: Secondary | ICD-10-CM | POA: Diagnosis not present

## 2017-12-24 DIAGNOSIS — J9611 Chronic respiratory failure with hypoxia: Secondary | ICD-10-CM | POA: Diagnosis not present

## 2018-01-24 DIAGNOSIS — J849 Interstitial pulmonary disease, unspecified: Secondary | ICD-10-CM | POA: Diagnosis not present

## 2018-01-24 DIAGNOSIS — J9611 Chronic respiratory failure with hypoxia: Secondary | ICD-10-CM | POA: Diagnosis not present

## 2018-01-26 ENCOUNTER — Other Ambulatory Visit: Payer: Self-pay | Admitting: Emergency Medicine

## 2018-02-23 DIAGNOSIS — J849 Interstitial pulmonary disease, unspecified: Secondary | ICD-10-CM | POA: Diagnosis not present

## 2018-02-23 DIAGNOSIS — J9611 Chronic respiratory failure with hypoxia: Secondary | ICD-10-CM | POA: Diagnosis not present

## 2018-03-24 DIAGNOSIS — J8410 Pulmonary fibrosis, unspecified: Secondary | ICD-10-CM | POA: Diagnosis not present

## 2018-03-24 DIAGNOSIS — E785 Hyperlipidemia, unspecified: Secondary | ICD-10-CM | POA: Diagnosis not present

## 2018-03-24 DIAGNOSIS — E118 Type 2 diabetes mellitus with unspecified complications: Secondary | ICD-10-CM | POA: Diagnosis not present

## 2018-03-24 DIAGNOSIS — Z1331 Encounter for screening for depression: Secondary | ICD-10-CM | POA: Diagnosis not present

## 2018-03-24 DIAGNOSIS — I50810 Right heart failure, unspecified: Secondary | ICD-10-CM | POA: Diagnosis not present

## 2018-03-24 DIAGNOSIS — I2510 Atherosclerotic heart disease of native coronary artery without angina pectoris: Secondary | ICD-10-CM | POA: Diagnosis not present

## 2018-03-24 DIAGNOSIS — Z95828 Presence of other vascular implants and grafts: Secondary | ICD-10-CM | POA: Diagnosis not present

## 2018-03-24 DIAGNOSIS — Z298 Encounter for other specified prophylactic measures: Secondary | ICD-10-CM | POA: Diagnosis not present

## 2018-03-24 DIAGNOSIS — E538 Deficiency of other specified B group vitamins: Secondary | ICD-10-CM | POA: Diagnosis not present

## 2018-03-26 DIAGNOSIS — J9611 Chronic respiratory failure with hypoxia: Secondary | ICD-10-CM | POA: Diagnosis not present

## 2018-03-26 DIAGNOSIS — J849 Interstitial pulmonary disease, unspecified: Secondary | ICD-10-CM | POA: Diagnosis not present

## 2018-04-07 ENCOUNTER — Other Ambulatory Visit: Payer: Self-pay | Admitting: Emergency Medicine

## 2018-04-26 DIAGNOSIS — J9611 Chronic respiratory failure with hypoxia: Secondary | ICD-10-CM | POA: Diagnosis not present

## 2018-04-26 DIAGNOSIS — J849 Interstitial pulmonary disease, unspecified: Secondary | ICD-10-CM | POA: Diagnosis not present

## 2018-05-25 DIAGNOSIS — J849 Interstitial pulmonary disease, unspecified: Secondary | ICD-10-CM | POA: Diagnosis not present

## 2018-05-25 DIAGNOSIS — J9611 Chronic respiratory failure with hypoxia: Secondary | ICD-10-CM | POA: Diagnosis not present

## 2018-06-02 ENCOUNTER — Ambulatory Visit: Payer: Medicare HMO | Admitting: Emergency Medicine

## 2018-06-12 ENCOUNTER — Other Ambulatory Visit: Payer: Self-pay | Admitting: Emergency Medicine

## 2018-06-25 DIAGNOSIS — J849 Interstitial pulmonary disease, unspecified: Secondary | ICD-10-CM | POA: Diagnosis not present

## 2018-06-25 DIAGNOSIS — J9611 Chronic respiratory failure with hypoxia: Secondary | ICD-10-CM | POA: Diagnosis not present

## 2018-06-29 DIAGNOSIS — E785 Hyperlipidemia, unspecified: Secondary | ICD-10-CM | POA: Diagnosis not present

## 2018-06-29 DIAGNOSIS — K210 Gastro-esophageal reflux disease with esophagitis: Secondary | ICD-10-CM | POA: Diagnosis not present

## 2018-06-29 DIAGNOSIS — E559 Vitamin D deficiency, unspecified: Secondary | ICD-10-CM | POA: Diagnosis not present

## 2018-06-29 DIAGNOSIS — Z95828 Presence of other vascular implants and grafts: Secondary | ICD-10-CM | POA: Diagnosis not present

## 2018-06-29 DIAGNOSIS — I2510 Atherosclerotic heart disease of native coronary artery without angina pectoris: Secondary | ICD-10-CM | POA: Diagnosis not present

## 2018-06-29 DIAGNOSIS — I50810 Right heart failure, unspecified: Secondary | ICD-10-CM | POA: Diagnosis not present

## 2018-06-29 DIAGNOSIS — Z298 Encounter for other specified prophylactic measures: Secondary | ICD-10-CM | POA: Diagnosis not present

## 2018-06-29 DIAGNOSIS — E538 Deficiency of other specified B group vitamins: Secondary | ICD-10-CM | POA: Diagnosis not present

## 2018-06-29 DIAGNOSIS — J8410 Pulmonary fibrosis, unspecified: Secondary | ICD-10-CM | POA: Diagnosis not present

## 2018-07-17 ENCOUNTER — Other Ambulatory Visit: Payer: Self-pay | Admitting: *Deleted

## 2018-07-17 MED ORDER — PREDNISONE 10 MG PO TABS: 30.0000 mg | ORAL_TABLET | Freq: Every day | ORAL | 0 refills | Status: DC

## 2018-07-25 DIAGNOSIS — J849 Interstitial pulmonary disease, unspecified: Secondary | ICD-10-CM | POA: Diagnosis not present

## 2018-07-25 DIAGNOSIS — J9611 Chronic respiratory failure with hypoxia: Secondary | ICD-10-CM | POA: Diagnosis not present

## 2018-08-03 DIAGNOSIS — I2510 Atherosclerotic heart disease of native coronary artery without angina pectoris: Secondary | ICD-10-CM | POA: Diagnosis not present

## 2018-08-03 DIAGNOSIS — E118 Type 2 diabetes mellitus with unspecified complications: Secondary | ICD-10-CM | POA: Diagnosis not present

## 2018-08-03 DIAGNOSIS — E785 Hyperlipidemia, unspecified: Secondary | ICD-10-CM | POA: Diagnosis not present

## 2018-08-03 DIAGNOSIS — I50810 Right heart failure, unspecified: Secondary | ICD-10-CM | POA: Diagnosis not present

## 2018-08-03 DIAGNOSIS — Z298 Encounter for other specified prophylactic measures: Secondary | ICD-10-CM | POA: Diagnosis not present

## 2018-08-03 DIAGNOSIS — E559 Vitamin D deficiency, unspecified: Secondary | ICD-10-CM | POA: Diagnosis not present

## 2018-08-03 DIAGNOSIS — J8410 Pulmonary fibrosis, unspecified: Secondary | ICD-10-CM | POA: Diagnosis not present

## 2018-08-03 DIAGNOSIS — E538 Deficiency of other specified B group vitamins: Secondary | ICD-10-CM | POA: Diagnosis not present

## 2018-08-03 DIAGNOSIS — Z95828 Presence of other vascular implants and grafts: Secondary | ICD-10-CM | POA: Diagnosis not present

## 2018-08-04 DIAGNOSIS — E118 Type 2 diabetes mellitus with unspecified complications: Secondary | ICD-10-CM | POA: Diagnosis not present

## 2018-08-04 DIAGNOSIS — E785 Hyperlipidemia, unspecified: Secondary | ICD-10-CM | POA: Diagnosis not present

## 2018-08-05 ENCOUNTER — Other Ambulatory Visit: Payer: Self-pay | Admitting: Emergency Medicine

## 2018-08-25 DIAGNOSIS — J849 Interstitial pulmonary disease, unspecified: Secondary | ICD-10-CM | POA: Diagnosis not present

## 2018-08-25 DIAGNOSIS — J9611 Chronic respiratory failure with hypoxia: Secondary | ICD-10-CM | POA: Diagnosis not present

## 2018-08-31 ENCOUNTER — Ambulatory Visit: Payer: Medicare HMO | Admitting: Emergency Medicine

## 2018-09-01 ENCOUNTER — Encounter: Payer: Self-pay | Admitting: Emergency Medicine

## 2018-09-01 ENCOUNTER — Other Ambulatory Visit: Payer: Self-pay

## 2018-09-01 ENCOUNTER — Ambulatory Visit: Payer: Medicare HMO | Admitting: Emergency Medicine

## 2018-09-01 DIAGNOSIS — J9611 Chronic respiratory failure with hypoxia: Secondary | ICD-10-CM | POA: Diagnosis not present

## 2018-09-01 DIAGNOSIS — J8410 Pulmonary fibrosis, unspecified: Secondary | ICD-10-CM

## 2018-09-01 DIAGNOSIS — J439 Emphysema, unspecified: Secondary | ICD-10-CM

## 2018-09-01 MED ORDER — AEROCHAMBER MV MISC: 0 refills | Status: AC

## 2018-09-01 MED ORDER — ALBUTEROL SULFATE HFA 108 (90 BASE) MCG/ACT IN AERS: 2.0000 | INHALATION_SPRAY | RESPIRATORY_TRACT | 5 refills | Status: DC | PRN

## 2018-09-01 NOTE — Assessment & Plan Note (Signed)
Not entirely clear which process is driving his progressive dyspnea.  He does feel worse since his prednisone was decreased from 20 mg to 10 mg daily.  I suspect that in large part this is due to his COPD.  I think that we can continue Imuran twice a day, try to get his prednisone to his low-dose as possible.  I will try to get him to 15 mg daily and stay at that dose, see how he tolerates.  Plan to repeat his CT scan of the chest this summer, we will plan this at his next visit.

## 2018-09-01 NOTE — Patient Instructions (Addendum)
Please increase your prednisone to 20mg  daily for 2 weeks, then decrease to 15mg  daily. We will try to stay at that dose  Continue your imuran 50mg  twice a day Stop Stiolto for now.  We will give you an albuterol inhaler to keep available, use 2 puffs if you need it for shortness of breath, cough, wheeze, chest tightness.  We may decide to retry scheduled inhaled medication at some point in the future.  We will repeat your CT scan of the chest this Summer. We will plan the timing at your next visit.  Follow with Dr. Lamonte Sakai in 2 months or sooner if you have any problems.

## 2018-09-01 NOTE — Progress Notes (Signed)
Subjective:    Patient ID: Jeff Lynch, male    DOB: May 06, 1940, 78 y.o.   MRN: [id number]  C.C.:  Follow-up for ILD, Immunosuppressed Status, Chronic Hypoxic Respiratory Failure, Pulmonary Emphysema, & GERD.  HPI 78 year old gentleman with rheumatoid arthritis and history of tobacco use/COPD.  He has been followed by Dr. 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  He has been managed with Imuran 50 mg twice a day and prednisone 20 mg daily.  He is on Bactrim 3 times a week for prophylaxis.  His most recent CT scan of the chest was done on 09/06/2016 and showed stable interstitial disease with some consolidative and nodular components inferiorly superimposed on emphysema.  He wears oxygen at 2-3 L/min. He believes that he has had some increased exertional SOB, happens at shorter distances. He reports that he underwent a pred burst in the last year, now back to 49m - has been on 126mat times in the past. He is not currently on any BD's. He did try them several years ago, not sure they benefited him.   Pulmonary function testing done today was reviewed by me.  This shows restricted spirometry with some probable superimposed obstruction based on a normal FEV1 to FVC ratio, no significant bronchodilator response, decreased diffusion capacity that corrects to 72% of predicted when adjusted for his alveolar volume.  His FEV1 has decreased slightly to 1.6 L from 1.97 L 07/18/2016, 1.86 L 05/22/2016.  ROV 09/01/2018 --this is a follow-up visit for patient with interstitial lung disease in the setting of rheumatoid arthritis, history of tobacco and superimposed obstructive lung disease.  FEV1 1.6 L as above.  At his last visit I tried decreasing his prednisone to 10 mg daily.  He remains on Imuran 50 mg twice daily, prophylactic Bactrim.  We also did a therapeutic trial of Stiolto to see if he would get benefit. He doesn't believe that he got much benefit,  doesn't believe he wants to stay on it. He is using O2 at 2-3L/min, increased with exertion. He feels that his breathing is worse than it was 2 months ago. He is having freq cough, gray sputum.    Review of Systems   Increased exertional dyspnea as above.      Objective:   Physical Exam BP (!) 150/102 (BP Location: Right Arm, Cuff Size: Normal)   Pulse (!) 103   Ht '5\' 7"'  (1.702 m)   Wt 168 lb (76.2 kg)   SpO2 95%   BMI 26.31 kg/m   Gen: Pleasant, chronically ill appearing, in no distress,  normal affect, motorized scooter  ENT: No lesions,  mouth clear,  oropharynx clear, no postnasal drip  Neck: No JVD, no stridor  Lungs: No use of accessory muscles, bilateral basilar insp crackles.   Cardiovascular: RRR, 3/6 systolic M, intact S2, trace peripheral edema  Musculoskeletal: No deformities, no cyanosis or clubbing  Neuro: alert, non focal  Skin: Warm, no lesions or rashes     PFT 07/18/16: FVC 2.39 L (67%) FEV1 1.97 L (77%) FEV1/FVC 0.83 FEF 25-75 2.13 L (116%)  DLCO corrected 37% 05/22/16: FVC 2.31 L (64%) FEV1 1.86 L (72%) FEV1/FVC 0.81 FEF 25-75 1.82 L (98%)                                                                                                                      DLCO corrected 35% 02/20/16: FVC 2.57 L (71%) FEV1 2.14 L (83%) FEV1/FVC 0.83 FEF 25-75 2.36 L (126%)                                                                                                                    DLCO corrected 43% (Hgb 14.3) 11/20/15: FVC 2.55 L (71%) FEV1 2.13 L (82%) FEV1/FVC 0.84 FEF 25-75 2.40 L (128%)                                                                                                                    DLCO corrected 37% (Hgb 15.7) 07/17/15: FVC 2.65 L (73%) FEV1 2.18 L (84%) FEV1/FVC 0.83 FEF 25-75 2.42 L (128%) no bronchodilator response TLC 4.09 L (65%)  RV 56% ERV 63% DLCO corrected 41% (Hgb 16.3)  6MWT 11/18/16:  Walked 114 meters / Baseline Sat 100% on 2 L/m / Nadir Sat 89% on 2 L/m at 3:40 (required 2 L/m to maintain with exertion; stopped at 3:40 w/ shortness of breath) 08/30/16:  Walked 168 meters / Baseline Sat 97% on RA / Nadir Sat 86% on RA @ 5:03 (requrired 5 L/m to maintain) 07/18/16:  Walked 168 meters / Baseline Sat 94% on RA / Nadir Sat 86% on RA @ 4:11 (required 3 L/m with exertion to maintain) 05/22/16:  Baseline Sat 84% on RA / Required 2 L/m pulse dose at rest / Requred 6 L/m continuous flow with exertion to maintain Sat >90% 11/20/15:  96 meters / Baseline Sat 95% on RA / Nadir Sat 83% on RA (required 2 L/m to maintain saturation & took multiple breaks during the walk with pain) 07/17/15: 192 meters / Baseline Sat 95% on RA / Nadir  Sat 82% on RA at End of Walk (required 2 L/m to maintain w/ exertion) 08/23/14:  Nadir saturation 86% on RA - Saturated 93% on 2 L/m continuous  IMAGING HRCT CHEST W/O 09/06/16 (previously reviewed by me):  Predominantly UIP pattern without significant progression. Centrilobular and paraseptal emphysema noted with some apical predominance but subpleural bleb formation is also noted within the lower lung zones. No pleural effusion or thickening. No pericardial effusion. No pathologic mediastinal adenopathy.  CXR PA/LAT 03/22/16 (previously reviewed by me): Chronic prominent bilateral interstitial markings and reticulation. No focal opacity or mass appreciated. No pleural effusion. Heart normal in size & mediastinum normal in contour.  HRCT CHEST W/O 06/26/15 (previously reviewed by me): Borderline enlarged mediastinal lymph nodes measuring up to 1.2 cm in short axis. Slightly basilar predominant subpleural reticular changes with some traction bronchiectasis and minimal groundglass. No appreciable honeycombing changes. No other parenchymal nodule or opacity appreciated. Centrilobular emphysema is also  present. No pleural effusion or thickening. No pericardial effusion.  CT CHEST W/O 12/31/13 (per radiologist):Numerous scattered lymph nodes in mediastinum including 1.2 cm short axis AP window lymph node. Coarse peripheral interstitial lung disease likely with early honeycombing. Underlying centrilobular emphysema. Nodule in left midlung. No other separate nodule identified. Thoracic spondylosis.  CARDIAC TTE (06/26/15): Mild LVH. EF 60-65%. LA mildly dilated. Mild to moderate aortic stenosis with mild regurgitation. Trivial mitral regurgitation. Left atrium mildly dilated.  LABS 12/18/16 CBC: 9.6/13.1/39.9/168  11/25/16 CBC: 11.6/13.4/40.0/200 MCV: 92 BMP: 143/4.2/101/28/19/1.08/97/10.0 LFT: 4.0/6.2/0.3/67/17/60  11/18/16 Alpha-1 antitrypsin: MM (150)  11/16/14 BMP: 140/4.5/101/23/11/0.79/119/9.2 LFT: 3.9/6.4/0.5/56/16/13 CBC: 9.0/14.8/45.2/199  01/26/14 CK: 104 ESR: 87 ANA: Negative Anti-CCP: 231.3 RF: 503      Assessment:  Postinflammatory pulmonary fibrosis with autoimmune features  Not entirely clear which process is driving his progressive dyspnea.  He does feel worse since his prednisone was decreased from 20 mg to 10 mg daily.  I suspect that in large part this is due to his COPD.  I think that we can continue Imuran twice a day, try to get his prednisone to his low-dose as possible.  I will try to get him to 15 mg daily and stay at that dose, see how he tolerates.  Plan to repeat his CT scan of the chest this summer, we will plan this at his next visit.  Pulmonary emphysema ([place]) He did not feel any benefit when we had a Stiolto but note that I also decreased his prednisone at the same time and may have confounded this.  Again try and get his prednisone back up to the lowest effective dose, suspect that it is a COPD that is responding, note increased sputum production since the prednisone was decreased from 20 mg to 10 mg.  I will give him 20 mg daily for 2 weeks,  then try to get the 50 mg and see if he tolerates.  I will also add albuterol for him to use as needed through a spacer.  Once we have him on a stable prednisone dose I can try to add long-acting bronchodilators again.  Chronic respiratory failure with hypoxia Continue his oxygen at 2 to 3 L/min as he has been using it  Baltazar Apo, MD, PhD 09/01/2018, 9:57 AM [facility] [phone] or if no answer [phone]

## 2018-09-01 NOTE — Assessment & Plan Note (Signed)
Continue his oxygen at 2 to 3 L/min as he has been using it

## 2018-09-01 NOTE — Assessment & Plan Note (Signed)
He did not feel any benefit when we had a Stiolto but note that I also decreased his prednisone at the same time and may have confounded this.  Again try and get his prednisone back up to the lowest effective dose, suspect that it is a COPD that is responding, note increased sputum production since the prednisone was decreased from 20 mg to 10 mg.  I will give him 20 mg daily for 2 weeks, then try to get the 50 mg and see if he tolerates.  I will also add albuterol for him to use as needed through a spacer.  Once we have him on a stable prednisone dose I can try to add long-acting bronchodilators again.

## 2018-09-02 ENCOUNTER — Ambulatory Visit: Payer: Medicare HMO | Admitting: Emergency Medicine

## 2018-09-07 ENCOUNTER — Other Ambulatory Visit: Payer: Self-pay | Admitting: Emergency Medicine

## 2018-09-22 ENCOUNTER — Telehealth: Payer: Self-pay | Admitting: Emergency Medicine

## 2018-09-22 MED ORDER — ALBUTEROL SULFATE HFA 108 (90 BASE) MCG/ACT IN AERS: 2.0000 | INHALATION_SPRAY | RESPIRATORY_TRACT | 1 refills | Status: AC | PRN

## 2018-09-22 NOTE — Telephone Encounter (Signed)
Called patient who wants rescue inhaler sent to [facility]. Called local pharmacy and cancelled previous script. Resent to [facility] Nothing further needed.

## 2018-09-24 DIAGNOSIS — J849 Interstitial pulmonary disease, unspecified: Secondary | ICD-10-CM | POA: Diagnosis not present

## 2018-09-24 DIAGNOSIS — J9611 Chronic respiratory failure with hypoxia: Secondary | ICD-10-CM | POA: Diagnosis not present

## 2018-10-25 DIAGNOSIS — J849 Interstitial pulmonary disease, unspecified: Secondary | ICD-10-CM | POA: Diagnosis not present

## 2018-10-25 DIAGNOSIS — J9611 Chronic respiratory failure with hypoxia: Secondary | ICD-10-CM | POA: Diagnosis not present

## 2018-10-30 ENCOUNTER — Ambulatory Visit: Payer: Medicare HMO | Admitting: Emergency Medicine

## 2018-11-05 DIAGNOSIS — Z9181 History of falling: Secondary | ICD-10-CM | POA: Diagnosis not present

## 2018-11-05 DIAGNOSIS — I2510 Atherosclerotic heart disease of native coronary artery without angina pectoris: Secondary | ICD-10-CM | POA: Diagnosis not present

## 2018-11-05 DIAGNOSIS — K210 Gastro-esophageal reflux disease with esophagitis: Secondary | ICD-10-CM | POA: Diagnosis not present

## 2018-11-05 DIAGNOSIS — E538 Deficiency of other specified B group vitamins: Secondary | ICD-10-CM | POA: Diagnosis not present

## 2018-11-05 DIAGNOSIS — E559 Vitamin D deficiency, unspecified: Secondary | ICD-10-CM | POA: Diagnosis not present

## 2018-11-05 DIAGNOSIS — M069 Rheumatoid arthritis, unspecified: Secondary | ICD-10-CM | POA: Diagnosis not present

## 2018-11-05 DIAGNOSIS — E785 Hyperlipidemia, unspecified: Secondary | ICD-10-CM | POA: Diagnosis not present

## 2018-11-05 DIAGNOSIS — J8410 Pulmonary fibrosis, unspecified: Secondary | ICD-10-CM | POA: Diagnosis not present

## 2018-11-05 DIAGNOSIS — E118 Type 2 diabetes mellitus with unspecified complications: Secondary | ICD-10-CM | POA: Diagnosis not present

## 2018-11-11 DIAGNOSIS — E785 Hyperlipidemia, unspecified: Secondary | ICD-10-CM | POA: Diagnosis not present

## 2018-11-11 DIAGNOSIS — E118 Type 2 diabetes mellitus with unspecified complications: Secondary | ICD-10-CM | POA: Diagnosis not present

## 2018-11-17 DIAGNOSIS — R918 Other nonspecific abnormal finding of lung field: Secondary | ICD-10-CM | POA: Diagnosis not present

## 2018-11-17 DIAGNOSIS — I10 Essential (primary) hypertension: Secondary | ICD-10-CM | POA: Diagnosis not present

## 2018-11-17 DIAGNOSIS — Z03818 Encounter for observation for suspected exposure to other biological agents ruled out: Secondary | ICD-10-CM | POA: Diagnosis not present

## 2018-11-17 DIAGNOSIS — R069 Unspecified abnormalities of breathing: Secondary | ICD-10-CM | POA: Diagnosis not present

## 2018-11-17 DIAGNOSIS — R079 Chest pain, unspecified: Secondary | ICD-10-CM | POA: Diagnosis not present

## 2018-11-17 DIAGNOSIS — R0600 Dyspnea, unspecified: Secondary | ICD-10-CM | POA: Diagnosis not present

## 2018-11-17 DIAGNOSIS — R05 Cough: Secondary | ICD-10-CM | POA: Diagnosis not present

## 2018-11-17 DIAGNOSIS — R0602 Shortness of breath: Secondary | ICD-10-CM | POA: Diagnosis not present

## 2018-11-18 DIAGNOSIS — I509 Heart failure, unspecified: Secondary | ICD-10-CM | POA: Diagnosis not present

## 2018-11-18 DIAGNOSIS — R5381 Other malaise: Secondary | ICD-10-CM | POA: Diagnosis not present

## 2018-11-18 DIAGNOSIS — C7951 Secondary malignant neoplasm of bone: Secondary | ICD-10-CM | POA: Diagnosis not present

## 2018-11-18 DIAGNOSIS — C3492 Malignant neoplasm of unspecified part of left bronchus or lung: Secondary | ICD-10-CM | POA: Diagnosis not present

## 2018-11-18 DIAGNOSIS — C3412 Malignant neoplasm of upper lobe, left bronchus or lung: Secondary | ICD-10-CM | POA: Diagnosis not present

## 2018-11-18 DIAGNOSIS — R079 Chest pain, unspecified: Secondary | ICD-10-CM | POA: Diagnosis not present

## 2018-11-18 DIAGNOSIS — I5032 Chronic diastolic (congestive) heart failure: Secondary | ICD-10-CM | POA: Diagnosis not present

## 2018-11-18 DIAGNOSIS — I352 Nonrheumatic aortic (valve) stenosis with insufficiency: Secondary | ICD-10-CM | POA: Diagnosis not present

## 2018-11-18 DIAGNOSIS — M6281 Muscle weakness (generalized): Secondary | ICD-10-CM | POA: Diagnosis not present

## 2018-11-18 DIAGNOSIS — I10 Essential (primary) hypertension: Secondary | ICD-10-CM | POA: Diagnosis not present

## 2018-11-18 DIAGNOSIS — I639 Cerebral infarction, unspecified: Secondary | ICD-10-CM | POA: Diagnosis not present

## 2018-11-18 DIAGNOSIS — R29818 Other symptoms and signs involving the nervous system: Secondary | ICD-10-CM | POA: Diagnosis not present

## 2018-11-18 DIAGNOSIS — R0602 Shortness of breath: Secondary | ICD-10-CM | POA: Diagnosis not present

## 2018-11-18 DIAGNOSIS — J8410 Pulmonary fibrosis, unspecified: Secondary | ICD-10-CM | POA: Diagnosis not present

## 2018-11-18 DIAGNOSIS — J189 Pneumonia, unspecified organism: Secondary | ICD-10-CM | POA: Diagnosis not present

## 2018-11-18 DIAGNOSIS — E872 Acidosis: Secondary | ICD-10-CM | POA: Diagnosis not present

## 2018-11-18 DIAGNOSIS — R600 Localized edema: Secondary | ICD-10-CM | POA: Diagnosis not present

## 2018-11-18 DIAGNOSIS — D72829 Elevated white blood cell count, unspecified: Secondary | ICD-10-CM | POA: Diagnosis not present

## 2018-11-18 DIAGNOSIS — R279 Unspecified lack of coordination: Secondary | ICD-10-CM | POA: Diagnosis not present

## 2018-11-18 DIAGNOSIS — C3490 Malignant neoplasm of unspecified part of unspecified bronchus or lung: Secondary | ICD-10-CM | POA: Diagnosis not present

## 2018-11-18 DIAGNOSIS — J849 Interstitial pulmonary disease, unspecified: Secondary | ICD-10-CM | POA: Diagnosis not present

## 2018-11-18 DIAGNOSIS — E875 Hyperkalemia: Secondary | ICD-10-CM | POA: Diagnosis not present

## 2018-11-18 DIAGNOSIS — R0600 Dyspnea, unspecified: Secondary | ICD-10-CM | POA: Diagnosis not present

## 2018-11-18 DIAGNOSIS — Z03818 Encounter for observation for suspected exposure to other biological agents ruled out: Secondary | ICD-10-CM | POA: Diagnosis not present

## 2018-11-18 DIAGNOSIS — R531 Weakness: Secondary | ICD-10-CM | POA: Diagnosis not present

## 2018-11-18 DIAGNOSIS — R918 Other nonspecific abnormal finding of lung field: Secondary | ICD-10-CM | POA: Diagnosis not present

## 2018-11-18 DIAGNOSIS — C61 Malignant neoplasm of prostate: Secondary | ICD-10-CM | POA: Diagnosis not present

## 2018-11-18 DIAGNOSIS — G893 Neoplasm related pain (acute) (chronic): Secondary | ICD-10-CM | POA: Diagnosis not present

## 2018-11-18 DIAGNOSIS — C7800 Secondary malignant neoplasm of unspecified lung: Secondary | ICD-10-CM | POA: Diagnosis not present

## 2018-11-18 DIAGNOSIS — J9621 Acute and chronic respiratory failure with hypoxia: Secondary | ICD-10-CM | POA: Diagnosis not present

## 2018-11-18 DIAGNOSIS — Z743 Need for continuous supervision: Secondary | ICD-10-CM | POA: Diagnosis not present

## 2018-11-18 DIAGNOSIS — J9611 Chronic respiratory failure with hypoxia: Secondary | ICD-10-CM | POA: Diagnosis not present

## 2018-11-19 DIAGNOSIS — C3492 Malignant neoplasm of unspecified part of left bronchus or lung: Secondary | ICD-10-CM | POA: Diagnosis not present

## 2018-11-19 DIAGNOSIS — E872 Acidosis: Secondary | ICD-10-CM | POA: Diagnosis not present

## 2018-11-19 DIAGNOSIS — E875 Hyperkalemia: Secondary | ICD-10-CM | POA: Diagnosis not present

## 2018-11-19 DIAGNOSIS — R918 Other nonspecific abnormal finding of lung field: Secondary | ICD-10-CM | POA: Diagnosis not present

## 2018-11-20 DIAGNOSIS — R918 Other nonspecific abnormal finding of lung field: Secondary | ICD-10-CM | POA: Diagnosis not present

## 2018-11-20 DIAGNOSIS — E875 Hyperkalemia: Secondary | ICD-10-CM | POA: Diagnosis not present

## 2018-11-20 DIAGNOSIS — E872 Acidosis: Secondary | ICD-10-CM | POA: Diagnosis not present

## 2018-11-21 DIAGNOSIS — C3412 Malignant neoplasm of upper lobe, left bronchus or lung: Secondary | ICD-10-CM

## 2018-11-21 DIAGNOSIS — R918 Other nonspecific abnormal finding of lung field: Secondary | ICD-10-CM | POA: Diagnosis not present

## 2018-11-21 DIAGNOSIS — E875 Hyperkalemia: Secondary | ICD-10-CM | POA: Diagnosis not present

## 2018-11-21 DIAGNOSIS — E872 Acidosis: Secondary | ICD-10-CM | POA: Diagnosis not present

## 2018-11-22 DIAGNOSIS — E875 Hyperkalemia: Secondary | ICD-10-CM | POA: Diagnosis not present

## 2018-11-22 DIAGNOSIS — R918 Other nonspecific abnormal finding of lung field: Secondary | ICD-10-CM | POA: Diagnosis not present

## 2018-11-22 DIAGNOSIS — E872 Acidosis: Secondary | ICD-10-CM | POA: Diagnosis not present

## 2018-11-23 DIAGNOSIS — R29818 Other symptoms and signs involving the nervous system: Secondary | ICD-10-CM | POA: Diagnosis not present

## 2018-11-23 DIAGNOSIS — R600 Localized edema: Secondary | ICD-10-CM | POA: Diagnosis not present

## 2018-11-23 DIAGNOSIS — C3490 Malignant neoplasm of unspecified part of unspecified bronchus or lung: Secondary | ICD-10-CM | POA: Diagnosis not present

## 2018-11-23 DIAGNOSIS — E872 Acidosis: Secondary | ICD-10-CM | POA: Diagnosis not present

## 2018-11-23 DIAGNOSIS — R918 Other nonspecific abnormal finding of lung field: Secondary | ICD-10-CM | POA: Diagnosis not present

## 2018-11-23 DIAGNOSIS — E875 Hyperkalemia: Secondary | ICD-10-CM | POA: Diagnosis not present

## 2018-11-24 DIAGNOSIS — E875 Hyperkalemia: Secondary | ICD-10-CM | POA: Diagnosis not present

## 2018-11-24 DIAGNOSIS — E872 Acidosis: Secondary | ICD-10-CM | POA: Diagnosis not present

## 2018-11-24 DIAGNOSIS — R918 Other nonspecific abnormal finding of lung field: Secondary | ICD-10-CM | POA: Diagnosis not present

## 2018-11-25 ENCOUNTER — Ambulatory Visit: Payer: Medicare HMO | Admitting: Primary Care

## 2018-11-25 DIAGNOSIS — J9611 Chronic respiratory failure with hypoxia: Secondary | ICD-10-CM | POA: Diagnosis not present

## 2018-11-25 DIAGNOSIS — E875 Hyperkalemia: Secondary | ICD-10-CM | POA: Diagnosis not present

## 2018-11-25 DIAGNOSIS — R918 Other nonspecific abnormal finding of lung field: Secondary | ICD-10-CM | POA: Diagnosis not present

## 2018-11-25 DIAGNOSIS — E872 Acidosis: Secondary | ICD-10-CM | POA: Diagnosis not present

## 2018-11-25 DIAGNOSIS — J849 Interstitial pulmonary disease, unspecified: Secondary | ICD-10-CM | POA: Diagnosis not present

## 2018-11-26 DIAGNOSIS — E875 Hyperkalemia: Secondary | ICD-10-CM | POA: Diagnosis not present

## 2018-11-26 DIAGNOSIS — R918 Other nonspecific abnormal finding of lung field: Secondary | ICD-10-CM | POA: Diagnosis not present

## 2018-11-26 DIAGNOSIS — E872 Acidosis: Secondary | ICD-10-CM | POA: Diagnosis not present

## 2018-11-27 DIAGNOSIS — R918 Other nonspecific abnormal finding of lung field: Secondary | ICD-10-CM | POA: Diagnosis not present

## 2018-11-27 DIAGNOSIS — E875 Hyperkalemia: Secondary | ICD-10-CM | POA: Diagnosis not present

## 2018-11-27 DIAGNOSIS — E872 Acidosis: Secondary | ICD-10-CM | POA: Diagnosis not present

## 2018-11-28 DIAGNOSIS — E875 Hyperkalemia: Secondary | ICD-10-CM | POA: Diagnosis not present

## 2018-11-28 DIAGNOSIS — R918 Other nonspecific abnormal finding of lung field: Secondary | ICD-10-CM | POA: Diagnosis not present

## 2018-11-28 DIAGNOSIS — E872 Acidosis: Secondary | ICD-10-CM | POA: Diagnosis not present

## 2018-11-29 DIAGNOSIS — E875 Hyperkalemia: Secondary | ICD-10-CM | POA: Diagnosis not present

## 2018-11-29 DIAGNOSIS — E872 Acidosis: Secondary | ICD-10-CM | POA: Diagnosis not present

## 2018-11-29 DIAGNOSIS — R918 Other nonspecific abnormal finding of lung field: Secondary | ICD-10-CM | POA: Diagnosis not present

## 2018-11-30 DIAGNOSIS — E875 Hyperkalemia: Secondary | ICD-10-CM | POA: Diagnosis not present

## 2018-11-30 DIAGNOSIS — E872 Acidosis: Secondary | ICD-10-CM | POA: Diagnosis not present

## 2018-11-30 DIAGNOSIS — R918 Other nonspecific abnormal finding of lung field: Secondary | ICD-10-CM | POA: Diagnosis not present

## 2018-12-01 DIAGNOSIS — E875 Hyperkalemia: Secondary | ICD-10-CM | POA: Diagnosis not present

## 2018-12-01 DIAGNOSIS — R918 Other nonspecific abnormal finding of lung field: Secondary | ICD-10-CM | POA: Diagnosis not present

## 2018-12-01 DIAGNOSIS — E872 Acidosis: Secondary | ICD-10-CM | POA: Diagnosis not present

## 2018-12-02 DIAGNOSIS — C3410 Malignant neoplasm of upper lobe, unspecified bronchus or lung: Secondary | ICD-10-CM | POA: Diagnosis not present

## 2018-12-02 DIAGNOSIS — R0902 Hypoxemia: Secondary | ICD-10-CM | POA: Diagnosis not present

## 2018-12-02 DIAGNOSIS — E872 Acidosis: Secondary | ICD-10-CM | POA: Diagnosis not present

## 2018-12-02 DIAGNOSIS — J189 Pneumonia, unspecified organism: Secondary | ICD-10-CM | POA: Diagnosis not present

## 2018-12-02 DIAGNOSIS — I509 Heart failure, unspecified: Secondary | ICD-10-CM | POA: Diagnosis not present

## 2018-12-02 DIAGNOSIS — I10 Essential (primary) hypertension: Secondary | ICD-10-CM | POA: Diagnosis not present

## 2018-12-02 DIAGNOSIS — C7951 Secondary malignant neoplasm of bone: Secondary | ICD-10-CM | POA: Diagnosis not present

## 2018-12-02 DIAGNOSIS — R0789 Other chest pain: Secondary | ICD-10-CM | POA: Diagnosis not present

## 2018-12-02 DIAGNOSIS — Z743 Need for continuous supervision: Secondary | ICD-10-CM | POA: Diagnosis not present

## 2018-12-02 DIAGNOSIS — C3490 Malignant neoplasm of unspecified part of unspecified bronchus or lung: Secondary | ICD-10-CM | POA: Diagnosis not present

## 2018-12-02 DIAGNOSIS — M6281 Muscle weakness (generalized): Secondary | ICD-10-CM | POA: Diagnosis not present

## 2018-12-02 DIAGNOSIS — J9611 Chronic respiratory failure with hypoxia: Secondary | ICD-10-CM | POA: Diagnosis not present

## 2018-12-02 DIAGNOSIS — I2510 Atherosclerotic heart disease of native coronary artery without angina pectoris: Secondary | ICD-10-CM | POA: Diagnosis not present

## 2018-12-02 DIAGNOSIS — I352 Nonrheumatic aortic (valve) stenosis with insufficiency: Secondary | ICD-10-CM | POA: Diagnosis not present

## 2018-12-02 DIAGNOSIS — I350 Nonrheumatic aortic (valve) stenosis: Secondary | ICD-10-CM | POA: Diagnosis not present

## 2018-12-02 DIAGNOSIS — E875 Hyperkalemia: Secondary | ICD-10-CM | POA: Diagnosis not present

## 2018-12-02 DIAGNOSIS — Z8673 Personal history of transient ischemic attack (TIA), and cerebral infarction without residual deficits: Secondary | ICD-10-CM | POA: Diagnosis not present

## 2018-12-02 DIAGNOSIS — I714 Abdominal aortic aneurysm, without rupture: Secondary | ICD-10-CM | POA: Diagnosis not present

## 2018-12-02 DIAGNOSIS — J8410 Pulmonary fibrosis, unspecified: Secondary | ICD-10-CM | POA: Diagnosis not present

## 2018-12-02 DIAGNOSIS — I200 Unstable angina: Secondary | ICD-10-CM | POA: Diagnosis not present

## 2018-12-02 DIAGNOSIS — R7989 Other specified abnormal findings of blood chemistry: Secondary | ICD-10-CM | POA: Diagnosis not present

## 2018-12-02 DIAGNOSIS — R279 Unspecified lack of coordination: Secondary | ICD-10-CM | POA: Diagnosis not present

## 2018-12-02 DIAGNOSIS — I499 Cardiac arrhythmia, unspecified: Secondary | ICD-10-CM | POA: Diagnosis not present

## 2018-12-02 DIAGNOSIS — R0781 Pleurodynia: Secondary | ICD-10-CM | POA: Diagnosis not present

## 2018-12-02 DIAGNOSIS — R531 Weakness: Secondary | ICD-10-CM | POA: Diagnosis not present

## 2018-12-02 DIAGNOSIS — M8458XA Pathological fracture in neoplastic disease, other specified site, initial encounter for fracture: Secondary | ICD-10-CM | POA: Diagnosis not present

## 2018-12-02 DIAGNOSIS — R000 Tachycardia, unspecified: Secondary | ICD-10-CM | POA: Diagnosis not present

## 2018-12-02 DIAGNOSIS — I214 Non-ST elevation (NSTEMI) myocardial infarction: Secondary | ICD-10-CM | POA: Diagnosis not present

## 2018-12-02 DIAGNOSIS — C61 Malignant neoplasm of prostate: Secondary | ICD-10-CM | POA: Diagnosis not present

## 2018-12-02 DIAGNOSIS — C3412 Malignant neoplasm of upper lobe, left bronchus or lung: Secondary | ICD-10-CM | POA: Diagnosis not present

## 2018-12-02 DIAGNOSIS — R918 Other nonspecific abnormal finding of lung field: Secondary | ICD-10-CM | POA: Diagnosis not present

## 2018-12-02 DIAGNOSIS — D491 Neoplasm of unspecified behavior of respiratory system: Secondary | ICD-10-CM | POA: Diagnosis not present

## 2018-12-02 DIAGNOSIS — R5381 Other malaise: Secondary | ICD-10-CM | POA: Diagnosis not present

## 2018-12-02 DIAGNOSIS — J9621 Acute and chronic respiratory failure with hypoxia: Secondary | ICD-10-CM | POA: Diagnosis not present

## 2018-12-02 DIAGNOSIS — C7800 Secondary malignant neoplasm of unspecified lung: Secondary | ICD-10-CM | POA: Diagnosis not present

## 2018-12-02 DIAGNOSIS — R079 Chest pain, unspecified: Secondary | ICD-10-CM | POA: Diagnosis not present

## 2018-12-02 DIAGNOSIS — E871 Hypo-osmolality and hyponatremia: Secondary | ICD-10-CM | POA: Diagnosis not present

## 2018-12-04 DIAGNOSIS — I214 Non-ST elevation (NSTEMI) myocardial infarction: Secondary | ICD-10-CM | POA: Diagnosis not present

## 2018-12-04 DIAGNOSIS — R000 Tachycardia, unspecified: Secondary | ICD-10-CM | POA: Diagnosis not present

## 2018-12-04 DIAGNOSIS — C3490 Malignant neoplasm of unspecified part of unspecified bronchus or lung: Secondary | ICD-10-CM | POA: Diagnosis not present

## 2018-12-04 DIAGNOSIS — I499 Cardiac arrhythmia, unspecified: Secondary | ICD-10-CM | POA: Diagnosis not present

## 2018-12-04 DIAGNOSIS — I352 Nonrheumatic aortic (valve) stenosis with insufficiency: Secondary | ICD-10-CM | POA: Diagnosis not present

## 2018-12-04 DIAGNOSIS — R079 Chest pain, unspecified: Secondary | ICD-10-CM | POA: Diagnosis not present

## 2018-12-04 DIAGNOSIS — D491 Neoplasm of unspecified behavior of respiratory system: Secondary | ICD-10-CM | POA: Diagnosis not present

## 2018-12-04 DIAGNOSIS — R0602 Shortness of breath: Secondary | ICD-10-CM | POA: Diagnosis not present

## 2018-12-04 DIAGNOSIS — J8410 Pulmonary fibrosis, unspecified: Secondary | ICD-10-CM

## 2018-12-04 DIAGNOSIS — R0789 Other chest pain: Secondary | ICD-10-CM | POA: Diagnosis not present

## 2018-12-04 DIAGNOSIS — I200 Unstable angina: Secondary | ICD-10-CM | POA: Diagnosis not present

## 2018-12-04 DIAGNOSIS — Z8673 Personal history of transient ischemic attack (TIA), and cerebral infarction without residual deficits: Secondary | ICD-10-CM

## 2018-12-04 DIAGNOSIS — M8458XA Pathological fracture in neoplastic disease, other specified site, initial encounter for fracture: Secondary | ICD-10-CM | POA: Diagnosis not present

## 2018-12-04 DIAGNOSIS — J189 Pneumonia, unspecified organism: Secondary | ICD-10-CM | POA: Diagnosis not present

## 2018-12-04 DIAGNOSIS — E871 Hypo-osmolality and hyponatremia: Secondary | ICD-10-CM | POA: Diagnosis not present

## 2018-12-04 DIAGNOSIS — R918 Other nonspecific abnormal finding of lung field: Secondary | ICD-10-CM | POA: Diagnosis not present

## 2018-12-04 DIAGNOSIS — I714 Abdominal aortic aneurysm, without rupture: Secondary | ICD-10-CM | POA: Diagnosis not present

## 2018-12-04 DIAGNOSIS — Z7401 Bed confinement status: Secondary | ICD-10-CM | POA: Diagnosis not present

## 2018-12-04 DIAGNOSIS — R0781 Pleurodynia: Secondary | ICD-10-CM

## 2018-12-04 DIAGNOSIS — R7989 Other specified abnormal findings of blood chemistry: Secondary | ICD-10-CM

## 2018-12-04 DIAGNOSIS — C7951 Secondary malignant neoplasm of bone: Secondary | ICD-10-CM

## 2018-12-04 DIAGNOSIS — I350 Nonrheumatic aortic (valve) stenosis: Secondary | ICD-10-CM

## 2018-12-04 DIAGNOSIS — C3410 Malignant neoplasm of upper lobe, unspecified bronchus or lung: Secondary | ICD-10-CM

## 2018-12-04 DIAGNOSIS — M2550 Pain in unspecified joint: Secondary | ICD-10-CM | POA: Diagnosis not present

## 2018-12-04 DIAGNOSIS — J9611 Chronic respiratory failure with hypoxia: Secondary | ICD-10-CM

## 2018-12-04 DIAGNOSIS — C3412 Malignant neoplasm of upper lobe, left bronchus or lung: Secondary | ICD-10-CM | POA: Diagnosis not present

## 2018-12-04 DIAGNOSIS — I2510 Atherosclerotic heart disease of native coronary artery without angina pectoris: Secondary | ICD-10-CM

## 2018-12-04 DIAGNOSIS — R0902 Hypoxemia: Secondary | ICD-10-CM | POA: Diagnosis not present

## 2018-12-05 DIAGNOSIS — C3410 Malignant neoplasm of upper lobe, unspecified bronchus or lung: Secondary | ICD-10-CM | POA: Diagnosis not present

## 2018-12-05 DIAGNOSIS — J9611 Chronic respiratory failure with hypoxia: Secondary | ICD-10-CM | POA: Diagnosis not present

## 2018-12-05 DIAGNOSIS — C7951 Secondary malignant neoplasm of bone: Secondary | ICD-10-CM | POA: Diagnosis not present

## 2018-12-05 DIAGNOSIS — R0781 Pleurodynia: Secondary | ICD-10-CM | POA: Diagnosis not present

## 2018-12-06 DIAGNOSIS — C3490 Malignant neoplasm of unspecified part of unspecified bronchus or lung: Secondary | ICD-10-CM | POA: Diagnosis not present

## 2018-12-06 DIAGNOSIS — I2510 Atherosclerotic heart disease of native coronary artery without angina pectoris: Secondary | ICD-10-CM | POA: Diagnosis not present

## 2018-12-06 DIAGNOSIS — J9611 Chronic respiratory failure with hypoxia: Secondary | ICD-10-CM | POA: Diagnosis not present

## 2018-12-06 DIAGNOSIS — R7989 Other specified abnormal findings of blood chemistry: Secondary | ICD-10-CM

## 2018-12-06 DIAGNOSIS — E871 Hypo-osmolality and hyponatremia: Secondary | ICD-10-CM | POA: Diagnosis not present

## 2018-12-06 DIAGNOSIS — I350 Nonrheumatic aortic (valve) stenosis: Secondary | ICD-10-CM | POA: Diagnosis not present

## 2018-12-06 DIAGNOSIS — C7951 Secondary malignant neoplasm of bone: Secondary | ICD-10-CM | POA: Diagnosis not present

## 2018-12-06 DIAGNOSIS — R0781 Pleurodynia: Secondary | ICD-10-CM | POA: Diagnosis not present

## 2018-12-06 DIAGNOSIS — C3410 Malignant neoplasm of upper lobe, unspecified bronchus or lung: Secondary | ICD-10-CM | POA: Diagnosis not present

## 2018-12-07 DIAGNOSIS — C7951 Secondary malignant neoplasm of bone: Secondary | ICD-10-CM | POA: Diagnosis not present

## 2018-12-07 DIAGNOSIS — C3410 Malignant neoplasm of upper lobe, unspecified bronchus or lung: Secondary | ICD-10-CM | POA: Diagnosis not present

## 2018-12-07 DIAGNOSIS — R0781 Pleurodynia: Secondary | ICD-10-CM | POA: Diagnosis not present

## 2018-12-07 DIAGNOSIS — J9611 Chronic respiratory failure with hypoxia: Secondary | ICD-10-CM | POA: Diagnosis not present

## 2018-12-08 DIAGNOSIS — I214 Non-ST elevation (NSTEMI) myocardial infarction: Secondary | ICD-10-CM | POA: Diagnosis not present

## 2018-12-08 DIAGNOSIS — C3410 Malignant neoplasm of upper lobe, unspecified bronchus or lung: Secondary | ICD-10-CM | POA: Diagnosis not present

## 2018-12-08 DIAGNOSIS — R0781 Pleurodynia: Secondary | ICD-10-CM | POA: Diagnosis not present

## 2018-12-08 DIAGNOSIS — M8458XA Pathological fracture in neoplastic disease, other specified site, initial encounter for fracture: Secondary | ICD-10-CM | POA: Diagnosis not present

## 2018-12-08 DIAGNOSIS — C7951 Secondary malignant neoplasm of bone: Secondary | ICD-10-CM | POA: Diagnosis not present

## 2018-12-08 DIAGNOSIS — R7989 Other specified abnormal findings of blood chemistry: Secondary | ICD-10-CM | POA: Diagnosis not present

## 2018-12-08 DIAGNOSIS — I350 Nonrheumatic aortic (valve) stenosis: Secondary | ICD-10-CM | POA: Diagnosis not present

## 2018-12-08 DIAGNOSIS — J189 Pneumonia, unspecified organism: Secondary | ICD-10-CM | POA: Diagnosis not present

## 2018-12-08 DIAGNOSIS — Z8673 Personal history of transient ischemic attack (TIA), and cerebral infarction without residual deficits: Secondary | ICD-10-CM | POA: Diagnosis not present

## 2018-12-08 DIAGNOSIS — J9611 Chronic respiratory failure with hypoxia: Secondary | ICD-10-CM | POA: Diagnosis not present

## 2018-12-25 DIAGNOSIS — J849 Interstitial pulmonary disease, unspecified: Secondary | ICD-10-CM | POA: Diagnosis not present

## 2018-12-25 DIAGNOSIS — J9611 Chronic respiratory failure with hypoxia: Secondary | ICD-10-CM | POA: Diagnosis not present

## 2019-01-03 DEATH — deceased

## 2020-01-03 DEATH — deceased

## 2021-01-02 DEATH — deceased
